# Patient Record
Sex: Male | Born: 1990 | Race: Black or African American | Hispanic: No | Smoking: Never smoker
Health system: Southern US, Community
[De-identification: ages and names within clinical notes are randomized; demographics above are authoritative.]

## PROBLEM LIST (undated history)

## (undated) DIAGNOSIS — F329 Major depressive disorder, single episode, unspecified: Secondary | ICD-10-CM

## (undated) DIAGNOSIS — F32A Depression, unspecified: Secondary | ICD-10-CM

## (undated) DIAGNOSIS — B009 Herpesviral infection, unspecified: Secondary | ICD-10-CM

## (undated) HISTORY — DX: Major depressive disorder, single episode, unspecified: F32.9

## (undated) HISTORY — DX: Depression, unspecified: F32.A

---

## 2014-09-04 ENCOUNTER — Emergency Department: Payer: Self-pay | Admitting: Emergency Medicine

## 2016-01-26 ENCOUNTER — Encounter: Payer: Self-pay | Admitting: Family Medicine

## 2016-01-27 ENCOUNTER — Encounter: Payer: Self-pay | Admitting: Family Medicine

## 2016-01-27 ENCOUNTER — Ambulatory Visit (INDEPENDENT_AMBULATORY_CARE_PROVIDER_SITE_OTHER): Payer: BLUE CROSS/BLUE SHIELD | Admitting: Family Medicine

## 2016-01-27 VITALS — BP 122/80 | HR 83 | Temp 98.3°F | Resp 14 | Ht 66.0 in | Wt 295.5 lb

## 2016-01-27 DIAGNOSIS — Z113 Encounter for screening for infections with a predominantly sexual mode of transmission: Secondary | ICD-10-CM | POA: Diagnosis not present

## 2016-01-27 DIAGNOSIS — Z131 Encounter for screening for diabetes mellitus: Secondary | ICD-10-CM | POA: Diagnosis not present

## 2016-01-27 DIAGNOSIS — Z13220 Encounter for screening for lipoid disorders: Secondary | ICD-10-CM | POA: Diagnosis not present

## 2016-01-27 DIAGNOSIS — B001 Herpesviral vesicular dermatitis: Secondary | ICD-10-CM

## 2016-01-27 DIAGNOSIS — Z0000 Encounter for general adult medical examination without abnormal findings: Secondary | ICD-10-CM | POA: Diagnosis not present

## 2016-01-27 DIAGNOSIS — Z8659 Personal history of other mental and behavioral disorders: Secondary | ICD-10-CM | POA: Insufficient documentation

## 2016-01-27 DIAGNOSIS — Z23 Encounter for immunization: Secondary | ICD-10-CM | POA: Diagnosis not present

## 2016-01-27 DIAGNOSIS — Z114 Encounter for screening for human immunodeficiency virus [HIV]: Secondary | ICD-10-CM

## 2016-01-27 MED ORDER — VALACYCLOVIR HCL 1 G PO TABS: 1000.0000 mg | ORAL_TABLET | Freq: Two times a day (BID) | ORAL | Status: DC

## 2016-01-27 NOTE — Progress Notes (Signed)
Name: Derek Khan   MRN: [id number]    DOB: 1991-04-19   Date:01/27/2016       Progress Note  Subjective  Chief Complaint  Chief Complaint  Patient presents with  . Annual Exam  . Mouth Lesions    patient states they occur out of the blue every month    HPI  Male Exam: he has gained weight but feeling well. Engaged, planning on getting married 07/2017. No urinary symptoms. He snores at times but wakes up feeling rested.   Morbid obesity: he states he has changed his diet over the past 8 of weeks has lost about 20 lbs , by stopping sodas and going to the gym.   Fever Blisters: he states outbreaks have been happening monthly and would like to go back on Valtrex. He states rash is painful and on upper lips, used to be triggered by illness, but now it just happens.   Patient Active Problem List   Diagnosis Date Noted  . History of depression 01/27/2016  . Morbid obesity (HCC) 01/27/2016    History reviewed. No pertinent past surgical history.  Family History  Problem Relation Age of Onset  . Depression Mother   . Obesity Mother   . Obesity Father   . Obesity Sister   . ADD / ADHD Brother   . Obesity Brother     Social History   Social History  . Marital Status: Single    Spouse Name: N/A  . Number of Children: N/A  . Years of Education: N/A   Occupational History  . Not on file.   Social History Main Topics  . Smoking status: Never Smoker   . Smokeless tobacco: Not on file  . Alcohol Use: 0.6 oz/week    1 Standard drinks or equivalent per week     Comment: social drinker  . Drug Use: No  . Sexual Activity:    Partners: Female   Other Topics Concern  . Not on file   Social History Narrative  . No narrative on file    No current outpatient prescriptions on file.  No Known Allergies   ROS  Constitutional: Negative for fever, positive for  weight change.  Respiratory: Negative for cough and shortness of breath.   Cardiovascular: Negative for  chest pain or palpitations.  Gastrointestinal: Negative for abdominal pain, no bowel changes.  Musculoskeletal: Negative for gait problem or joint swelling.  Skin: Negative for rash.  Neurological: Negative for dizziness or headache.  No other specific complaints in a complete review of systems (except as listed in HPI above).  Objective  Filed Vitals:   01/27/16 1105  BP: 122/80  Pulse: 83  Temp: 98.3 F (36.8 C)  TempSrc: Oral  Resp: 14  Height:  (1.676 m)  Weight: 295 lb 8 oz (134.038 kg)  SpO2: 98%    Body mass index is 47.72 kg/(m^2).  Physical Exam  Constitutional: Patient appears well-developed and well-nourished. No distress.  HENT: Head: Normocephalic and atraumatic. Ears: B TMs ok, no erythema or effusion; Nose: Nose normal. Mouth/Throat: Oropharynx is clear and moist. No oropharyngeal exudate.  Eyes: Conjunctivae and EOM are normal. Pupils are equal, round, and reactive to light. No scleral icterus.  Neck: Normal range of motion. Neck supple. No JVD present. No thyromegaly present.  Cardiovascular: Normal rate, regular rhythm and normal heart sounds.  No murmur heard. No BLE edema. Pulmonary/Chest: Effort normal and breath sounds normal. No respiratory distress. Abdominal: Soft. Bowel sounds are normal,  no distension. There is no tenderness. no masses MALE GENITALIA: Normal descended testes bilaterally, no masses palpated, no hernias, no lesions, no discharge RECTAL: not done Musculoskeletal: Normal range of motion, no joint effusions. No gross deformities Neurological: he is alert and oriented to person, place, and time. No cranial nerve deficit. Coordination, balance, strength, speech and gait are normal.  Skin: Skin is warm and dry. No rash noted. No erythema.  Psychiatric: Patient has a normal mood and affect. behavior is normal. Judgment and thought content normal.  PHQ2/9: Depression screen PHQ 2/9 01/27/2016  Decreased Interest 0  Down, Depressed,  Hopeless 0  PHQ - 2 Score 0     Fall Risk: Fall Risk  01/27/2016  Falls in the past year? 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 Encounter for routine history and physical exam for male  Discussed importance of 150 minutes of physical activity weekly, eat two servings of fish weekly, eat one serving of tree nuts ( cashews, pistachios, pecans, almonds.Marland Kitchen.) every other day, eat 6 servings of fruit/vegetables daily and drink plenty of water and avoid sweet beverages.   2. Morbid obesity, unspecified obesity type (HCC)  - TSH Discussed with the patient the risk posed by an increased BMI. Discussed importance of portion control, calorie counting and at least 150 minutes of physical activity weekly. Avoid sweet beverages and drink more water. Eat at least 6 servings of fruit and vegetables daily   3. Lipid screening  - Lipid panel  4. Diabetes mellitus screening  -hgbA1C  5. Encounter for screening for HIV  - HIV antibody  6. Routine screening for STI (sexually transmitted infection)  - Chlamydia/Gonococcus/Trichomonas, NAA  7. Needs flu shot  - Flu Vaccine QUAD 36+ mos IM  8. Need for Tdap vaccination  - Tdap vaccine greater than or equal to 7yo IM

## 2016-01-28 LAB — HEMOGLOBIN A1C
Est. average glucose Bld gHb Est-mCnc: 114 mg/dL
Hgb A1c MFr Bld: 5.6 % (ref 4.8–5.6)

## 2016-01-28 LAB — LIPID PANEL
CHOL/HDL RATIO: 3.8 ratio (ref 0.0–5.0)
Cholesterol, Total: 165 mg/dL (ref 100–199)
HDL: 44 mg/dL (ref >39)
LDL Calculated: 104 mg/dL — ABNORMAL HIGH (ref 0–99)
Triglycerides: 84 mg/dL (ref 0–149)
VLDL CHOLESTEROL CAL: 17 mg/dL (ref 5–40)

## 2016-01-28 LAB — TSH: TSH: 1.43 u[IU]/mL (ref 0.450–4.500)

## 2016-01-28 LAB — HIV ANTIBODY (ROUTINE TESTING W REFLEX): HIV SCREEN 4TH GENERATION: NONREACTIVE

## 2016-01-29 LAB — CHLAMYDIA/GONOCOCCUS/TRICHOMONAS, NAA
CHLAMYDIA BY NAA: NEGATIVE
Gonococcus by NAA: NEGATIVE
Trich vag by NAA: NEGATIVE

## 2016-02-03 NOTE — Addendum Note (Signed)
Addended by: Alba CorySOWLES, Felix Meras F on: 02/03/2016 01:21 PM   Modules accepted: Kipp BroodSmartSet

## 2016-02-06 NOTE — Addendum Note (Signed)
Addended by: Alba CorySOWLES, Edu On F on: 02/06/2016 01:00 PM   Modules accepted: Kipp BroodSmartSet

## 2017-07-19 ENCOUNTER — Ambulatory Visit (INDEPENDENT_AMBULATORY_CARE_PROVIDER_SITE_OTHER): Payer: BLUE CROSS/BLUE SHIELD | Admitting: Family Medicine

## 2017-07-19 ENCOUNTER — Encounter: Payer: Self-pay | Admitting: Family Medicine

## 2017-07-19 DIAGNOSIS — B001 Herpesviral vesicular dermatitis: Secondary | ICD-10-CM | POA: Insufficient documentation

## 2017-07-19 MED ORDER — VALACYCLOVIR HCL 1 G PO TABS
1000.0000 mg | ORAL_TABLET | Freq: Two times a day (BID) | ORAL | 0 refills | Status: DC
Start: 2017-07-19 — End: 2017-12-19

## 2017-07-19 NOTE — Progress Notes (Signed)
Name: Derek Khan   MRN: [id number]    DOB: 06-23-1991   Date:07/19/2017       Progress Note  Subjective  Chief Complaint  Chief Complaint  Patient presents with  . Medication Refill    HPI   Morbid obesity: he states he has changed his diet last year and lost 20 lbs, and states he kept losing until Nov 2017 lowest weight was 260's lbs. He started to not be as compliant with diet plan and has gained some weight back, but is still 12 lbs lighter than last March. He is getting married September 2018 and will resume his diet.   Fever Blisters: he states outbreaks have been happening monthly and would like to go back on Valtrex. 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   Patient Active Problem List   Diagnosis Date Noted  . Fever blister 07/19/2017  . History of depression 01/27/2016  . Morbid obesity (HCC) 01/27/2016    History reviewed. No pertinent surgical history.  Family History  Problem Relation Age of Onset  . Depression Mother   . Obesity Mother   . Obesity Father   . Obesity Sister   . ADD / ADHD Brother   . Obesity Brother     Social History   Social History  . Marital status: Single    Spouse name: N/A  . Number of children: N/A  . Years of education: N/A   Occupational History  . wear house worker  [employer]    physical job   Social History Main Topics  . Smoking status: Never Smoker  . Smokeless tobacco: Never Used  . Alcohol use 0.6 oz/week    1 Standard drinks or equivalent per week     Comment: social drinker  . Drug use: No  . Sexual activity: Yes    Partners: Female   Other Topics Concern  . 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  valACYclovir (VALTREX) 1000 MG tablet, Take 1 tablet (1,000 mg total) by mouth 2 (two) times daily. Prn outbreak, Disp: 30 tablet, Rfl: 0  No Known Allergies   ROS  Ten systems reviewed and is negative except as mentioned in HPI   Objective  Vitals:   07/19/17 1347  BP: 122/64  Pulse: (!) 101  Resp: 18  Temp: 98 F (36.7 C)  SpO2: 98%  Weight: 284 lb 3 oz (128.9 kg)  Height: 5\' 6"  (1.676 m)    Body mass index is 45.87 kg/m.  Physical Exam  Constitutional: Patient appears well-developed and well-nourished. Obese No distress.  HEENT: head atraumatic, normocephalic, pupils equal and reactive to light,  neck supple, throat within normal limits, fever blisters on right upper lip Cardiovascular: Normal rate, regular rhythm and normal heart sounds.  No murmur heard. No BLE edema. Pulmonary/Chest: Effort normal and breath sounds normal. No respiratory distress. Abdominal: Soft.  There is no tenderness. Psychiatric: Patient has a normal mood and affect. behavior is normal. Judgment and thought content normal.  PHQ2/9: Depression screen PHQ 2/9 01/27/2016  Decreased Interest 0  Down, Depressed, Hopeless 0  PHQ - 2 Score 0  Fall Risk: Fall Risk  01/27/2016  Falls in the past year? No    Assessment & Plan  1. Morbid obesity, unspecified obesity type [facility])  Discussed with the patient the risk posed by an increased BMI. Discussed importance of portion control, calorie counting and at least 150 minutes of physical activity weekly. Avoid sweet beverages and drink more water. Eat at least 6 servings of fruit and vegetables daily   2. Fever blister  - valACYclovir (VALTREX) 1000 MG tablet; Take 1 tablet (1,000 mg total) by mouth 2 (two) times daily. Prn outbreak  Dispense: 30 tablet; Refill: 0  He will return for labs and a physical

## 2017-10-28 ENCOUNTER — Ambulatory Visit: Payer: BLUE CROSS/BLUE SHIELD | Admitting: Family Medicine

## 2017-12-19 ENCOUNTER — Ambulatory Visit (INDEPENDENT_AMBULATORY_CARE_PROVIDER_SITE_OTHER): Payer: BLUE CROSS/BLUE SHIELD | Admitting: Family Medicine

## 2017-12-19 ENCOUNTER — Encounter: Payer: Self-pay | Admitting: Family Medicine

## 2017-12-19 VITALS — BP 106/70 | HR 86 | Temp 98.6°F | Resp 14 | Ht 67.0 in | Wt 291.0 lb

## 2017-12-19 DIAGNOSIS — B001 Herpesviral vesicular dermatitis: Secondary | ICD-10-CM

## 2017-12-19 DIAGNOSIS — H9192 Unspecified hearing loss, left ear: Secondary | ICD-10-CM

## 2017-12-19 DIAGNOSIS — R0683 Snoring: Secondary | ICD-10-CM

## 2017-12-19 DIAGNOSIS — Z0000 Encounter for general adult medical examination without abnormal findings: Secondary | ICD-10-CM | POA: Diagnosis not present

## 2017-12-19 DIAGNOSIS — Z13220 Encounter for screening for lipoid disorders: Secondary | ICD-10-CM

## 2017-12-19 DIAGNOSIS — Z131 Encounter for screening for diabetes mellitus: Secondary | ICD-10-CM

## 2017-12-19 MED ORDER — VALACYCLOVIR HCL 1 G PO TABS: 1000.0000 mg | ORAL_TABLET | Freq: Two times a day (BID) | ORAL | 0 refills | Status: DC

## 2017-12-19 NOTE — Progress Notes (Signed)
Name: Derek Khan   MRN: [id number]    DOB: 12-24-1990   Date:12/19/2017       Progress Note  Subjective  Chief Complaint  Chief Complaint  Patient presents with  . Annual Exam    HPI  Patient presents for annual CPE  Wife is concerned about the fact that he snores and has pauses during his sleep, he also has to void during the night, denies headaches in am's but feels sleepy throughout the day  Hearing: he complained of decreased hearing left side, we will check hearing test   USPSTF grade A and B recommendations:  Diet: trying a healthier diet, cooking at home, more protein and veggies avoiding carbohydrate Exercise: active at work, but denies regular exercise routine  Depression:  Dep[facility] screen [facility] 2/9 12/19/2017 01/27/2016  Decreased Interest 0 0  Down, Depressed, Hopeless 0 0  PHQ - 2 Score 0 0    Hypertension:  BP Readings from Last 3 Encounters:  12/19/17 106/70  07/19/17 122/64  01/27/16 122/80    Obesity: Wt Readings from Last 3 Encounters:  12/19/17 291 lb (132 kg)  07/19/17 284 lb 3 oz (128.9 kg)  01/27/16 295 lb 8 oz (134 kg)   BMI Readings from Last 3 Encounters:  12/19/17 45.58 kg/m  07/19/17 45.87 kg/m  01/27/16 47.69 kg/m     Lipids:  Lab Results  Component Value Date   CHOL 165 01/27/2016   Lab Results  Component Value Date   HDL 44 01/27/2016   Lab Results  Component Value Date   LDLCALC 104 (H) 01/27/2016   Lab Results  Component Value Date   TRIG 84 01/27/2016   Lab Results  Component Value Date   CHOLHDL 3.8 01/27/2016    Married STD testing and prevention (chl/gon/syphilis): not interested HIV, hep C: not interested   IPSS Questionnaire (AUA-7): Over the past month.   1)  How often have you had a sensation of not emptying your bladder completely after you finish urinating?  0 - Not at all  2)  How often have you had to urinate again less than two hours after you finished urinating? 0 - Not at all  3)   How often have you found you stopped and started again several times when you urinated?  0 - Not at all  4) How difficult have you found it to postpone urination?  1 - Lees than 1 in time 5  5) How often have you had a weak urinary stream?  1 - Less than 1 time in 5  6) How often have you had to push or strain to begin urination?  0 - Not at all  7) How many times did you most typically get up to urinate from the time you went to bed until the time you got up in the morning?  1 - 1 time  Total score:  0-7 mildly symptomatic   8-19 moderately symptomatic   20-35 severely symptomatic    Advanced Care Planning: A voluntary discussion about advance care planning including the explanation and discussion of advance directives.  Discussed health care proxy and Living will, and the patient was able to identify a health care proxy as wife .  Patient does not have a living will at present time. If patient does have living will, I have requested they bring this to the clinic to be scanned in to their chart.  Patient Active Problem List   Diagnosis Date Noted  . Fever  blister 07/19/2017  . History of depression 01/27/2016  . Morbid obesity (HCC) 01/27/2016    History reviewed. No pertinent surgical history.  Family History  Problem Relation Age of Onset  . Depression Mother   . Obesity Mother   . Obesity Father   . Obesity Sister   . ADD / ADHD Brother   . Obesity Brother     Social History   Socioeconomic History  . Marital status: Married    Spouse name: Lesly Rubenstein  . Number of children: 0  . Years of education: Not on file  . Highest education level: Some college, no degree  Social Needs  . Financial resource strain: Not very hard  . Food insecurity - worry: Never true  . Food insecurity - inability: Never true  . Transportation needs - medical: No  . Transportation needs - non-medical: No  Occupational History  . Occupation: Database administrator     Employer: [id number]    Comment:  physical job  Tobacco Use  . Smoking status: Never Smoker  . Smokeless tobacco: Never Used  Substance and Sexual Activity  . Alcohol use: Yes    Alcohol/week: 0.6 oz    Types: 1 Standard drinks or equivalent per week    Comment: social drinker  . Drug use: No  . Sexual activity: Yes    Partners: Female    Birth control/protection: Other-see comments    Comment: wife takes opc  Other Topics Concern  . 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  valACYclovir (VALTREX) 1000 MG tablet, Take 1 tablet (1,000 mg total) by mouth 2 (two) times daily. Prn outbreak, Disp: 30 tablet, Rfl: 0  No Known Allergies   ROS   Constitutional: Negative for fever, positive for  weight change.  Respiratory: Negative for cough and shortness of breath.   Cardiovascular: Negative for chest pain or palpitations.  Gastrointestinal: Negative for abdominal pain, no bowel changes.  Musculoskeletal: Negative for gait problem or joint swelling.  Skin: Negative for rash.  Neurological: Negative for dizziness or headache.  No other specific complaints in a complete review of systems (except as listed in HPI above).   Objective  Vitals:   12/19/17 1019  BP: 106/70  Pulse: 86  Resp: 14  Temp: 98.6 F (37 C)  TempSrc: Oral  SpO2: 98%  Weight: 291 lb (132 kg)  Height: 5\' 7"  (1.702 m)    Body mass index is 45.58 kg/m.  Physical Exam  Constitutional: Patient appears well-developed and obese  No distress.  HENT: Head: Normocephalic and atraumatic. Ears: B TMs ok, no erythema or effusion; Nose: Nose normal. Mouth/Throat: Oropharynx is clear and moist. No oropharyngeal exudate.  Eyes: Conjunctivae and EOM are normal. Pupils are equal, round, and reactive to light. No scleral icterus.  Neck: Normal range of motion. Neck supple. No JVD present. No thyromegaly present.   Cardiovascular: Normal rate, regular rhythm and normal heart sounds.  No murmur heard. No BLE edema. Pulmonary/Chest: Effort normal and breath sounds normal. No respiratory distress. Abdominal: Soft. Bowel sounds are normal, no distension. There is no tenderness. no masses MALE GENITALIA: Normal descended testes bilaterally, no masses palpated, no hernias, no lesions, no discharge RECTAL:not done Musculoskeletal: Normal range of motion, no joint effusions. No gross deformities Neurological: he is alert and oriented to person, place, and time. No cranial nerve  deficit. Coordination, balance, strength, speech and gait are normal.  Skin: Skin is warm and dry. No rash noted. No erythema.  Psychiatric: Patient has a normal mood and affect. behavior is normal. Judgment and thought content normal.   PHQ2/9: Depression screen [facility] 2/9 12/19/2017 01/27/2016  Decreased Interest 0 0  Down, Depressed, Hopeless 0 0  PHQ - 2 Score 0 0     Fall Risk: Fall Risk  12/19/2017 01/27/2016  Falls in the past year? No No     Functional Status Survey: Is the patient deaf or have difficulty hearing?: No Does the patient have difficulty seeing, even when wearing glasses/contacts?: No Does the patient have difficulty concentrating, remembering, or making decisions?: No Does the patient have difficulty walking or climbing stairs?: No Does the patient have difficulty dressing or bathing?: No Does the patient have difficulty doing errands alone such as visiting a doctor's office or shopping?: No    Hearing Screening   125Hz  250Hz  500Hz  1000Hz  2000Hz  3000Hz  4000Hz  6000Hz  8000Hz   Right ear:   Fail Fail Pass  Pass    Left ear:   Pass Pass Pass  Pass      Assessment & Plan  1. Encounter for routine history and physical exam for male  Discussed importance of 150 minutes of physical activity weekly, eat two servings of fish weekly, eat one serving of tree nuts ( cashews, pistachios, pecans, almonds.Marland Kitchen) every other  day, eat 6 servings of fruit/vegetables daily and drink plenty of water and avoid sweet beverages.  - COMPLETE METABOLIC PANEL WITH GFR - CBC with Differential/Platelet - Hemoglobin A1c - Lipid panel  2. Lipid screening  - Lipid panel  3. Diabetes mellitus screening  - Hemoglobin A1c  4. Morbid obesity, unspecified obesity type [facility])  Discussed importance of 150 minutes of physical activity weekly, eat two servings of fish weekly, eat one serving of tree nuts ( cashews, pistachios, pecans, almonds.Marland Kitchen) every other day, eat 6 servings of fruit/vegetables daily and drink plenty of water and avoid sweet beverages.   5. Snoring  - Ambulatory referral to Sleep Studies  6. Decreased hearing of left ear  - Ambulatory referral to ENT

## 2017-12-19 NOTE — Patient Instructions (Signed)
Preventive Care 18-39 Years, Male Preventive care refers to lifestyle choices and visits with your health care provider that can promote health and wellness. What does preventive care include?  A yearly physical exam. This is also called an annual well check.  Dental exams once or twice a year.  Routine eye exams. Ask your health care provider how often you should have your eyes checked.  Personal lifestyle choices, including: ? Daily care of your teeth and gums. ? Regular physical activity. ? Eating a healthy diet. ? Avoiding tobacco and drug use. ? Limiting alcohol use. ? Practicing safe sex. What happens during an annual well check? The services and screenings done by your health care provider during your annual well check will depend on your age, overall health, lifestyle risk factors, and family history of disease. Counseling Your health care provider may ask you questions about your:  Alcohol use.  Tobacco use.  Drug use.  Emotional well-being.  Home and relationship well-being.  Sexual activity.  Eating habits.  Work and work Statistician.  Screening You may have the following tests or measurements:  Height, weight, and BMI.  Blood pressure.  Lipid and cholesterol levels. These may be checked every 5 years starting at age 34.  Diabetes screening. This is done by checking your blood sugar (glucose) after you have not eaten for a while (fasting).  Skin check.  Hepatitis C blood test.  Hepatitis B blood test.  Sexually transmitted disease (STD) testing.  Discuss your test results, treatment options, and if necessary, the need for more tests with your health care provider. Vaccines Your health care provider may recommend certain vaccines, such as:  Influenza vaccine. This is recommended every year.  Tetanus, diphtheria, and acellular pertussis (Tdap, Td) vaccine. You may need a Td booster every 10 years.  Varicella vaccine. You may need this if you  have not been vaccinated.  HPV vaccine. If you are 23 or younger, you may need three doses over 6 months.  Measles, mumps, and rubella (MMR) vaccine. You may need at least one dose of MMR.You may also need a second dose.  Pneumococcal 13-valent conjugate (PCV13) vaccine. You may need this if you have certain conditions and have not been vaccinated.  Pneumococcal polysaccharide (PPSV23) vaccine. You may need one or two doses if you smoke cigarettes or if you have certain conditions.  Meningococcal vaccine. One dose is recommended if you are age 65-21 years and a first-year college student living in a residence hall, or if you have one of several medical conditions. You may also need additional booster doses.  Hepatitis A vaccine. You may need this if you have certain conditions or if you travel or work in places where you may be exposed to hepatitis A.  Hepatitis B vaccine. You may need this if you have certain conditions or if you travel or work in places where you may be exposed to hepatitis B.  Haemophilus influenzae type b (Hib) vaccine. You may need this if you have certain risk factors.  Talk to your health care provider about which screenings and vaccines you need and how often you need them. This information is not intended to replace advice given to you by your health care provider. Make sure you discuss any questions you have with your health care provider. Document Released: 01/04/2002 Document Revised: 07/28/2016 Document Reviewed: 09/09/2015 Elsevier Interactive Patient Education  [employer].

## 2017-12-20 LAB — COMPLETE METABOLIC PANEL WITH GFR
AG Ratio: 1.4 (calc) (ref 1.0–2.5)
ALBUMIN MSPROF: 4.7 g/dL (ref 3.6–5.1)
ALKALINE PHOSPHATASE (APISO): 80 U/L (ref 40–115)
ALT: 22 U/L (ref 9–46)
AST: 17 U/L (ref 10–40)
BILIRUBIN TOTAL: 0.3 mg/dL (ref 0.2–1.2)
BUN: 17 mg/dL (ref 7–25)
CHLORIDE: 103 mmol/L (ref 98–110)
CO2: 26 mmol/L (ref 20–32)
CREATININE: 0.83 mg/dL (ref 0.60–1.35)
Calcium: 10.1 mg/dL (ref 8.6–10.3)
GFR, Est African American: 142 mL/min/{1.73_m2} (ref >=60)
GFR, Est Non African American: 122 mL/min/{1.73_m2} (ref >=60)
GLUCOSE: 82 mg/dL (ref 65–99)
Globulin: 3.4 g/dL (calc) (ref 1.9–3.7)
Potassium: 4.1 mmol/L (ref 3.5–5.3)
Sodium: 138 mmol/L (ref 135–146)
Total Protein: 8.1 g/dL (ref 6.1–8.1)

## 2017-12-20 LAB — CBC WITH DIFFERENTIAL/PLATELET
Basophils Absolute: 46 cells/uL (ref 0–200)
Basophils Relative: 0.5 %
EOS ABS: 120 {cells}/uL (ref 15–500)
Eosinophils Relative: 1.3 %
HEMATOCRIT: 45 % (ref 38.5–50.0)
Hemoglobin: 15.2 g/dL (ref 13.2–17.1)
LYMPHS ABS: 3156 {cells}/uL (ref 850–3900)
MCH: 26.4 pg — ABNORMAL LOW (ref 27.0–33.0)
MCHC: 33.8 g/dL (ref 32.0–36.0)
MCV: 78.1 fL — AB (ref 80.0–100.0)
MPV: 11.4 fL (ref 7.5–12.5)
Monocytes Relative: 6.8 %
Neutro Abs: 5253 cells/uL (ref 1500–7800)
Neutrophils Relative %: 57.1 %
Platelets: 208 10*3/uL (ref 140–400)
RBC: 5.76 10*6/uL (ref 4.20–5.80)
RDW: 13.7 % (ref 11.0–15.0)
Total Lymphocyte: 34.3 %
WBC: 9.2 10*3/uL (ref 3.8–10.8)
WBCMIX: 626 {cells}/uL (ref 200–950)

## 2017-12-20 LAB — HEMOGLOBIN A1C
HEMOGLOBIN A1C: 5.3 %{Hb} (ref <5.7)
MEAN PLASMA GLUCOSE: 105 (calc)
eAG (mmol/L): 5.8 (calc)

## 2017-12-20 LAB — LIPID PANEL
Cholesterol: 167 mg/dL (ref <200)
HDL: 45 mg/dL (ref >40)
LDL Cholesterol (Calc): 104 mg/dL (calc) — ABNORMAL HIGH
NON-HDL CHOLESTEROL (CALC): 122 mg/dL (ref <130)
Total CHOL/HDL Ratio: 3.7 (calc) (ref <5.0)
Triglycerides: 89 mg/dL (ref <150)

## 2018-05-08 ENCOUNTER — Encounter: Payer: Self-pay | Admitting: Nurse Practitioner

## 2018-05-08 ENCOUNTER — Ambulatory Visit (INDEPENDENT_AMBULATORY_CARE_PROVIDER_SITE_OTHER): Payer: BLUE CROSS/BLUE SHIELD | Admitting: Nurse Practitioner

## 2018-05-08 VITALS — BP 124/68 | HR 93 | Temp 98.5°F | Resp 18 | Ht 67.0 in | Wt 302.3 lb

## 2018-05-08 DIAGNOSIS — Z6841 Body Mass Index (BMI) 40.0 and over, adult: Secondary | ICD-10-CM

## 2018-05-08 DIAGNOSIS — M5432 Sciatica, left side: Secondary | ICD-10-CM

## 2018-05-08 MED ORDER — NAPROXEN 250 MG PO TABS: 250.0000 mg | ORAL_TABLET | Freq: Two times a day (BID) | ORAL | 0 refills | Status: DC

## 2018-05-08 MED ORDER — TIZANIDINE HCL 2 MG PO CAPS: 2.0000 mg | ORAL_CAPSULE | Freq: Three times a day (TID) | ORAL | 0 refills | Status: DC

## 2018-05-08 NOTE — Progress Notes (Addendum)
Name: Derek Khan   MRN: [id number]    DOB: [birth year]   Date:05/08/2018       Progress Note  Subjective  Chief Complaint  Chief Complaint  Patient presents with  . Leg Pain    left side on and off for 2 months  . Hip Pain    HPI [place] pain, denies swelling.  Patient endorses left leg pain ongoing for 2 months. Pain is intermittent. Sitting exacerbates pain and is resolves with walking. Patient has been taking tylenol and ibuprofen- sts ibuprofen works a little bit. Denies any injuries, shob, chest pain, does not smoke, no clotting disorders. Pain startes in thigh, if seated for a long time goes to knee and then when he stands up pain radiates up from hip/buttocks down left thight. States does stretching is hard due to pain. Sts pain has progressively worsened since it started but has been relatively the same in the last few weeks.   Patient Active Problem List   Diagnosis Date Noted  . Fever blister 07/19/2017  . History of depression 01/27/2016  . Morbid obesity (HCC) 01/27/2016    Past Medical History:  Diagnosis Date  . Depression     No past surgical history on file.  Social History   Tobacco Use  . Smoking status: Never Smoker  . Smokeless tobacco: Never Used  Substance Use Topics  . Alcohol use: Yes    Alcohol/week: 0.6 oz    Types: 1 Standard drinks or equivalent per week    Comment: social drinker     Current Outpatient Medications:  .  valACYclovir (VALTREX) 1000 MG tablet, Take 1 tablet (1,000 mg total) by mouth 2 (two) times daily. Prn outbreak, Disp: 30 tablet, Rfl: 0  No Known Allergies  ROS  Constitutional: Negative for fever or weight change.  Respiratory: Negative for cough and shortness of breath.   Cardiovascular: Negative for chest pain or palpitations.  Gastrointestinal: Negative for abdominal pain, no bowel changes.  Musculoskeletal: Negative for gait problem or joint  swelling.  Skin: Negative for rash.  Neurological: Negative for dizziness or headache.  No other specific complaints in a complete review of systems (except as listed in HPI above).  Objective  Vitals:   05/08/18 0927  BP: 124/68  Pulse: 93  Resp: 18  Temp: 98.5 F (36.9 C)  TempSrc: Oral  SpO2: 96%  Weight: (!) 302 lb 4.8 oz (137.1 kg)  Height: 5\' 7"  (1.702 m)    Body mass index is 47.35 kg/m.  Nursing Note and Vital Signs reviewed.  Physical Exam   Constitutional: Patient appears well-developed and well-nourished. Obese  No distress.  Cardiovascular: Normal rate, regular rhythm, S1/S2 present.  No murmur or rub heard.  Pulmonary/Chest: Effort normal and breath sounds clear. No respiratory distress or retractions. MSK: positive straight leg test causes more pain in left leg, patient has gluteal tenderness to palpation on left side. Right leg negative. No obvious deformities or swelling bilaterally, no heat noted.  Psychiatric: Patient has a normal mood and affect. behavior is normal. Judgment and thought content normal.  No results found for this or any previous visit (from the past 72 hour(s)).  Assessment & Plan  1. Sciatica of left side -low suspicion of blood clot due to phyiscal exam findings, however discussed risks and benefits of imaging, and ER precautions - Discussed weight loss  - naproxen (NAPROSYN) 250 MG tablet; Take 1 tablet (250 mg  total) by mouth 2 (two) times daily with a meal.  Dispense: 20 tablet; Refill: 0 - tizanidine (ZANAFLEX) 2 MG capsule; Take 1 capsule (2 mg total) by mouth 3 (three) times daily.  Dispense: 30 capsule; Refill: 0   2. Morbid obesity (HCC) Lost significant weight last year when he was regularly going to the gym and eating healthy but got different job and is less active. Plans to go back to eating healthy and working out.   3. BMI 45.0-49.9, adult (HCC)    -Red flags and when to present for emergency care or RTC including  fever >101.66F, chest pain, shortness of breath, new/worsening/un-resolving symptoms, leg swelling, bowel bladder incontinence reviewed with patient at time of visit. Follow up and care instructions discussed and provided in AVS.  ---------------------------------------- I have reviewed this encounter including the documentation in this note and/or discussed this patient with the provider, Sharyon Cable DNP AGNP-C. I am certifying that I agree with the content of this note as supervising physician. Baruch Gouty, MD [facility] 05/09/2018, 5:26 PM

## 2018-05-08 NOTE — Patient Instructions (Addendum)
- Use ice/heat on area for 20 minutes 4-5 times a day - Take naproxen twice day with meals ( do not take ibuprofen or aspirin with this medicine) If you have additional pain you can take tylenol - Take muscle relaxer as needed - About an hour after taking pain meds/muscle relaxer try to do light stretching - seek immediate medical attention if you are having chest pain, shortness of breath, bowel or bladder incontinence.   Back Exercises If you have pain in your back, do these exercises 2-3 times each day or as told by your doctor. When the pain goes away, do the exercises once each day, but repeat the steps more times for each exercise (do more repetitions). If you do not have pain in your back, do these exercises once each day or as told by your doctor. Exercises Single Knee to Chest  Do these steps 3-5 times in a row for each leg: 1. Lie on your back on a firm bed or the floor with your legs stretched out. 2. Bring one knee to your chest. 3. Hold your knee to your chest by grabbing your knee or thigh. 4. Pull on your knee until you feel a gentle stretch in your lower back. 5. Keep doing the stretch for 10-30 seconds. 6. Slowly let go of your leg and straighten it.  Pelvic Tilt  Do these steps 5-10 times in a row: 1. Lie on your back on a firm bed or the floor with your legs stretched out. 2. Bend your knees so they point up to the ceiling. Your feet should be flat on the floor. 3. Tighten your lower belly (abdomen) muscles to press your lower back against the floor. This will make your tailbone point up to the ceiling instead of pointing down to your feet or the floor. 4. Stay in this position for 5-10 seconds while you gently tighten your muscles and breathe evenly.  Cat-Cow  Do these steps until your lower back bends more easily: 1. Get on your hands and knees on a firm surface. Keep your hands under your shoulders, and keep your knees under your hips. You may put padding under  your knees. 2. Let your head hang down, and make your tailbone point down to the floor so your lower back is round like the back of a cat. 3. Stay in this position for 5 seconds. 4. Slowly lift your head and make your tailbone point up to the ceiling so your back hangs low (sags) like the back of a cow. 5. Stay in this position for 5 seconds.  Press-Ups  Do these steps 5-10 times in a row: 1. Lie on your belly (face-down) on the floor. 2. Place your hands near your head, about shoulder-width apart. 3. While you keep your back relaxed and keep your hips on the floor, slowly straighten your arms to raise the top half of your body and lift your shoulders. Do not use your back muscles. To make yourself more comfortable, you may change where you place your hands. 4. Stay in this position for 5 seconds. 5. Slowly return to lying flat on the floor.  Bridges  Do these steps 10 times in a row: 1. Lie on your back on a firm surface. 2. Bend your knees so they point up to the ceiling. Your feet should be flat on the floor. 3. Tighten your butt muscles and lift your butt off of the floor until your waist is almost as high  as your knees. If you do not feel the muscles working in your butt and the back of your thighs, slide your feet 1-2 inches farther away from your butt. 4. Stay in this position for 3-5 seconds. 5. Slowly lower your butt to the floor, and let your butt muscles relax.  If this exercise is too easy, try doing it with your arms crossed over your chest. Belly Crunches  Do these steps 5-10 times in a row: 1. Lie on your back on a firm bed or the floor with your legs stretched out. 2. Bend your knees so they point up to the ceiling. Your feet should be flat on the floor. 3. Cross your arms over your chest. 4. Tip your chin a little bit toward your chest but do not bend your neck. 5. Tighten your belly muscles and slowly raise your chest just enough to lift your shoulder blades a tiny  bit off of the floor. 6. Slowly lower your chest and your head to the floor.  Back Lifts Do these steps 5-10 times in a row: 1. Lie on your belly (face-down) with your arms at your sides, and rest your forehead on the floor. 2. Tighten the muscles in your legs and your butt. 3. Slowly lift your chest off of the floor while you keep your hips on the floor. Keep the back of your head in line with the curve in your back. Look at the floor while you do this. 4. Stay in this position for 3-5 seconds. 5. Slowly lower your chest and your face to the floor.  Contact a doctor if:  Your back pain gets a lot worse when you do an exercise.  Your back pain does not lessen 2 hours after you exercise. If you have any of these problems, stop doing the exercises. Do not do them again unless your doctor says it is okay. Get help right away if:  You have sudden, very bad back pain. If this happens, stop doing the exercises. Do not do them again unless your doctor says it is okay. This information is not intended to replace advice given to you by your health care provider. Make sure you discuss any questions you have with your health care provider. Document Released: 12/11/2010 Document Revised: 04/15/2016 Document Reviewed: 01/02/2015 Elsevier Interactive Patient Education  2018 [employer]   Healthy Living for prevention of Diabetes What nutrition changes can be made?  Eat healthy meals and snacks regularly. Keep a healthy snack with you for when you get hungry between meals, such as fruit or a handful of nuts.  Eat lean meats and proteins that are low in saturated fats, such as chicken, fish, egg whites, and beans. Avoid processed meats.  Eat plenty of fruits and vegetables and plenty of grains that have not been processed (whole grains). It is recommended that you eat: ? 1?2 cups of fruit every day. ? 2?3 cups of vegetables every day. ? 6?8 oz of whole grains every day, such as oats, whole  wheat, bulgur, brown rice, quinoa, and millet.  Eat low-fat dairy products, such as milk, yogurt, and cheese.  Eat foods that contain healthy fats, such as nuts, avocado, olive oil, and canola oil.  Drink water throughout the day. Avoid drinks that contain added sugar, such as soda or sweet tea.  Follow instructions from your health care provider about specific eating or drinking restrictions.  Control how much food you eat at a time (portion size). ? Check  food labels to find out the serving sizes of foods. ? Use a kitchen scale to weigh amounts of foods.  Saute or steam food instead of frying it. Cook with water or broth instead of oils or butter.  Limit your intake of: ? Salt (sodium). Have no more than 1 tsp (2,400 mg) of sodium a day. If you have heart disease or high blood pressure, have less than ? tsp (1,500 mg) of sodium a day. ? Saturated fat. This is fat that is solid at room temperature, such as butter or fat on meat. What lifestyle changes can be made?  Activity  Do moderate-intensity physical activity for at least 30 minutes on at least 5 days of the week, or as much as told by your health care provider.  Ask your health care provider what activities are safe for you. A mix of physical activities may be best, such as walking, swimming, cycling, and strength training.  Try to add physical activity into your day. For example: ? Park in spots that are farther away than usual, so that you walk more. For example, park in a far corner of the parking lot when you go to the office or the grocery store. ? Take a walk during your lunch break. ? Use stairs instead of elevators or escalators. Weight Loss  Lose weight as directed. Your health care provider can determine how much weight loss is best for you and can help you lose weight safely.  If you are overweight or obese, you may be instructed to lose at least 5?7 % of your body weight. Alcohol and Tobacco   Limit alcohol  intake to no more than 1 drink a day for nonpregnant women and 2 drinks a day for men. One drink equals 12 oz of beer, 5 oz of wine, or 1 oz of hard liquor.  Do not use any tobacco products, such as cigarettes, chewing tobacco, and e-cigarettes. If you need help quitting, ask your health care provider. Work With Your Health Care Provider  Have your blood glucose tested regularly, as told by your health care provider.  Discuss your risk factors and how you can reduce your risk for diabetes.  Get screening tests as told by your health care provider. You may have screening tests regularly, especially if you have certain risk factors for type 2 diabetes.  Make an appointment with a diet and nutrition specialist (registered dietitian). A registered dietitian can help you make a healthy eating plan and can help you understand portion sizes and food labels. Why are these changes important?  It is possible to prevent or delay type 2 diabetes and related health problems by making lifestyle and nutrition changes.  It can be difficult to recognize signs of type 2 diabetes. The best way to avoid possible damage to your body is to take actions to prevent the disease before you develop symptoms. What can happen if changes are not made?  Your blood glucose levels may keep increasing. Having high blood glucose for a long time is dangerous. Too much glucose in your blood can damage your blood vessels, heart, kidneys, nerves, and eyes.  You may develop prediabetes or type 2 diabetes. Type 2 diabetes can lead to many chronic health problems and complications, such as: ? Heart disease. ? Stroke. ? Blindness. ? Kidney disease. ? Depression. ? Poor circulation in the feet and legs, which could lead to surgical removal (amputation) in severe cases. Where to find support:  Ask your health  care provider to recommend a registered dietitian, diabetes educator, or weight loss program.  Look for local or online  weight loss groups.  Join a gym, fitness club, or outdoor activity group, such as a walking club. Where to find more information: To learn more about diabetes and diabetes prevention, visit:  American Diabetes Association (ADA): www.diabetes.[employer] of Diabetes and Digestive and Kidney Diseases: ToyArticles.ca  To learn more about healthy eating, visit:  The U.[place]. Department of Agriculture Architect), Choose My Plate: http://yates.biz/  Office of Disease Prevention and Health Promotion (ODPHP), Dietary Guidelines: ListingMagazine.si  Summary  You can reduce your risk for type 2 diabetes by increasing your physical activity, eating healthy foods, and losing weight as directed.  Talk with your health care provider about your risk for type 2 diabetes. Ask about any blood tests or screening tests that you need to have. This information is not intended to replace advice given to you by your health care provider. Make sure you discuss any questions you have with your health care provider. Document Released: 03/01/2016 Document Revised: 04/15/2016 Document Reviewed: 12/30/2015 Elsevier Interactive Patient Education  [employer].

## 2018-05-22 ENCOUNTER — Encounter: Payer: Self-pay | Admitting: Nurse Practitioner

## 2018-05-22 ENCOUNTER — Ambulatory Visit (INDEPENDENT_AMBULATORY_CARE_PROVIDER_SITE_OTHER): Payer: BLUE CROSS/BLUE SHIELD | Admitting: Nurse Practitioner

## 2018-05-22 VITALS — BP 101/62 | HR 94 | Temp 98.2°F | Resp 16 | Ht 67.0 in | Wt 300.6 lb

## 2018-05-22 DIAGNOSIS — M5432 Sciatica, left side: Secondary | ICD-10-CM

## 2018-05-22 NOTE — Patient Instructions (Signed)
Back Injury Prevention °Back injuries can be very painful. They can also be difficult to heal. After having one back injury, you are more likely to injure your back again. It is important to learn how to avoid injuring or re-injuring your back. The following tips can help you to prevent a back injury. °What should I know about physical fitness? °· Exercise for 30 minutes per day on most days of the week or as told by your doctor. Make sure to: °? Do aerobic exercises, such as walking, jogging, biking, or swimming. °? Do exercises that increase balance and strength, such as tai chi and yoga. °? Do stretching exercises. This helps with flexibility. °? Try to develop strong belly (abdominal) muscles. Your belly muscles help to support your back. °· Stay at a healthy weight. This helps to decrease your risk of a back injury. °What should I know about my diet? °· Talk with your doctor about your overall diet. Take supplements and vitamins only as told by your doctor. °· Talk with your doctor about how much calcium and vitamin D you need each day. These nutrients help to prevent weakening of the bones (osteoporosis). °· Include good sources of calcium in your diet, such as: °? Dairy products. °? Green leafy vegetables. °? Products that have had calcium added to them (fortified). °· Include good sources of vitamin D in your diet, such as: °? Milk. °? Foods that have had vitamin D added to them. °What should I know about my posture? °· Sit up straight and stand up straight. Avoid leaning forward when you sit or hunching over when you stand. °· Choose chairs that have good low-back (lumbar) support. °· If you work at a desk, sit close to it so you do not need to lean over. Keep your chin tucked in. Keep your neck drawn back. Keep your elbows bent so your arms look like the letter "L" (right angle). °· Sit high and close to the steering wheel when you drive. Add a low-back support to your car seat, if needed. °· Avoid sitting  or standing in one position for very long. Take breaks to get up, stretch, and walk around at least one time every hour. Take breaks every hour if you are driving for long periods of time. °· Sleep on your side with your knees slightly bent, or sleep on your back with a pillow under your knees. Do not lie on the front of your body to sleep. °What should I know about lifting, twisting, and reaching? °Lifting and Heavy Lifting ° °· Avoid heavy lifting, especially lifting over and over again. If you must do heavy lifting: °? Stretch before lifting. °? Work slowly. °? Rest between lifts. °? Use a tool such as a cart or a dolly to move objects if one is available. °? Make several small trips instead of carrying one heavy load. °? Ask for help when you need it, especially when moving big objects. °· Follow these steps when lifting: °? Stand with your feet shoulder-width apart. °? Get as close to the object as you can. Do not pick up a heavy object that is far from your body. °? Use handles or lifting straps if they are available. °? Bend at your knees. Squat down, but keep your heels off the floor. °? Keep your shoulders back. Keep your chin tucked in. Keep your back straight. °? Lift the object slowly while you tighten the muscles in your legs, belly, and butt. Keep the   object as close to the center of your body as possible. °· Follow these steps when putting down a heavy load: °? Stand with your feet shoulder-width apart. °? Lower the object slowly while you tighten the muscles in your legs, belly, and butt. Keep the object as close to the center of your body as possible. °? Keep your shoulders back. Keep your chin tucked in. Keep your back straight. °? Bend at your knees. Squat down, but keep your heels off the floor. °? Use handles or lifting straps if they are available. °Twisting and Reaching °· Avoid lifting heavy objects above your waist. °· Do not twist at your waist while you are lifting or carrying a load. If  you need to turn, move your feet. °· Do not bend over without bending at your knees. °· Avoid reaching over your head, across a table, or for an object on a high surface. °What are some other tips? °· Avoid wet floors and icy ground. Keep sidewalks clear of ice to prevent falls. °· Do not sleep on a mattress that is too soft or too hard. °· Keep items that you use often within easy reach. °· Put heavier objects on shelves at waist level, and put lighter objects on lower or higher shelves. °· Find ways to lower your stress, such as: °? Exercise. °? Massage. °? Relaxation techniques. °· Talk with your doctor if you feel anxious or depressed. These conditions can make back pain worse. °· Wear flat heel shoes with cushioned soles. °· Avoid making quick (sudden) movements. °· Use both shoulder straps when carrying a backpack. °· Do not use any tobacco products, including cigarettes, chewing tobacco, or electronic cigarettes. If you need help quitting, ask your doctor. °This information is not intended to replace advice given to you by your health care provider. Make sure you discuss any questions you have with your health care provider. °Document Released: 04/26/2008 Document Revised: 04/15/2016 Document Reviewed: 11/12/2014 °Elsevier Interactive Patient Education © 2018 Elsevier Inc. ° °

## 2018-05-22 NOTE — Progress Notes (Addendum)
Name: Derek Khan   MRN: [id number]    DOB: 1991/06/06   Date:05/22/2018       Progress Note  Subjective  Chief Complaint  Chief Complaint  Patient presents with  . Sciatica    on left side    HPI Back pain Pt was seen 2 weeks ago for left buttocks and leg pain- recommended ice/heat, naproxen, tizanidine and stretching. Has been taking naproxen and occasionally doing stretches with moderate relief. States he hasn't taken muscle relaxer or used ice/heat yet.   Wt Readings from Last 3 Encounters:  05/22/18 (!) 300 lb 9.6 oz (136.4 kg)  05/08/18 (!) 302 lb 4.8 oz (137.1 kg)  12/19/17 291 lb (132 kg)   Obesity  Drinks a lot of water, packs lunch; states getting ready to go to vacation in [place] but plans to eat well when returning.    Patient Active Problem List   Diagnosis Date Noted  . Fever blister 07/19/2017  . History of depression 01/27/2016  . Morbid obesity (HCC) 01/27/2016    Past Medical History:  Diagnosis Date  . Depression     History reviewed. No pertinent surgical history.  Social History   Tobacco Use  . Smoking status: Never Smoker  . Smokeless tobacco: Never Used  Substance Use Topics  . Alcohol use: Yes    Alcohol/week: 0.6 oz    Types: 1 Standard drinks or equivalent per week    Comment: social drinker     Current Outpatient Medications:  .  naproxen (NAPROSYN) 250 MG tablet, Take 1 tablet (250 mg total) by mouth 2 (two) times daily with a meal., Disp: 20 tablet, Rfl: 0 .  tizanidine (ZANAFLEX) 2 MG capsule, Take 1 capsule (2 mg total) by mouth 3 (three) times daily., Disp: 30 capsule, Rfl: 0 .  valACYclovir (VALTREX) 1000 MG tablet, Take 1 tablet (1,000 mg total) by mouth 2 (two) times daily. Prn outbreak, Disp: 30 tablet, Rfl: 0  No Known Allergies  ROS  Constitutional: Negative for fever or weight change.  Respiratory: Negative for cough and shortness of breath.   Cardiovascular: Negative for chest pain or palpitations.   Gastrointestinal: Negative for abdominal pain, no bowel changes.  Musculoskeletal: Negative for gait problem or joint swelling.  Skin: Negative for rash.  Neurological: Negative for dizziness or headache.  No other specific complaints in a complete review of systems (except as listed in HPI above).  Objective  Vitals:   05/22/18 1314  BP: 101/62  Pulse: 94  Resp: 16  Temp: 98.2 F (36.8 C)  TempSrc: Oral  SpO2: 96%  Weight: (!) 300 lb 9.6 oz (136.4 kg)  Height: 5\' 7"  (1.702 m)     Body mass index is 47.08 kg/m.  Nursing Note and Vital Signs reviewed.  Physical Exam   Constitutional: Patient appears well-developed and well-nourished. Obese  No distress.  Cardiovascular: Normal rate, regular rhythm, S1/S2 present.  No murmur or rub heard.  Pulmonary/Chest: Effort normal and breath sounds clear. No respiratory distress or retractions. MSK: steady gait, full ROM Psychiatric: Patient has a normal mood and affect. behavior is normal. Judgment and thought content normal.  No results found for this or any previous visit (from the past 72 hour(s)).  Assessment & Plan  1. Sciatica of left side - discussed naproxen was not long-term solution; encouraged non-pharm management and proper body mechanics at work.   2. Morbid obesity (HCC) -education    ------------------------------------------------------ I have reviewed this encounter including the documentation  in this note and/or discussed this patient with the provider, Sharyon CableElizabeth Miel Wisener DNP AGNP-C. I am certifying that I agree with the content of this note as supervising physician. Baruch GoutyMelinda Lada, MD [facility] 05/22/2018, 6:11 PM

## 2018-07-21 ENCOUNTER — Encounter: Payer: Self-pay | Admitting: Family Medicine

## 2018-07-21 ENCOUNTER — Other Ambulatory Visit: Payer: Self-pay | Admitting: Family Medicine

## 2018-07-21 DIAGNOSIS — B001 Herpesviral vesicular dermatitis: Secondary | ICD-10-CM

## 2018-07-25 ENCOUNTER — Other Ambulatory Visit: Payer: Self-pay | Admitting: Nurse Practitioner

## 2018-07-25 ENCOUNTER — Other Ambulatory Visit: Payer: Self-pay | Admitting: Family Medicine

## 2018-07-25 DIAGNOSIS — B001 Herpesviral vesicular dermatitis: Secondary | ICD-10-CM

## 2018-07-25 MED ORDER — VALACYCLOVIR HCL 1 G PO TABS: 1000.0000 mg | ORAL_TABLET | Freq: Two times a day (BID) | ORAL | 0 refills | Status: DC

## 2018-07-25 NOTE — Telephone Encounter (Signed)
Refill request for general medication: Valtrex 1000 mg  Last office visit: 05/22/2018  Last physical exam: 12/19/2017  Follow up visit: None indicated

## 2018-07-25 NOTE — Telephone Encounter (Signed)
Refill request for general medication. Valtrex   Last office visit 05/22/2018   No follow-ups on file.

## 2018-11-26 ENCOUNTER — Ambulatory Visit
Admission: EM | Admit: 2018-11-26 | Discharge: 2018-11-26 | Disposition: A | Discharge status: Home / Self Care | Payer: BLUE CROSS/BLUE SHIELD | Attending: Family Medicine | Admitting: Family Medicine

## 2018-11-26 ENCOUNTER — Other Ambulatory Visit: Payer: Self-pay

## 2018-11-26 DIAGNOSIS — L03031 Cellulitis of right toe: Secondary | ICD-10-CM

## 2018-11-26 DIAGNOSIS — M79674 Pain in right toe(s): Secondary | ICD-10-CM | POA: Diagnosis not present

## 2018-11-26 HISTORY — DX: Herpesviral infection, unspecified: B00.9

## 2018-11-26 MED ORDER — DOXYCYCLINE HYCLATE 100 MG PO CAPS: 100.0000 mg | ORAL_CAPSULE | Freq: Two times a day (BID) | ORAL | 0 refills | Status: DC

## 2018-11-26 MED ORDER — MUPIROCIN 2 % EX OINT: 1.0000 "application " | TOPICAL_OINTMENT | Freq: Two times a day (BID) | CUTANEOUS | 0 refills | Status: AC

## 2018-11-26 NOTE — ED Provider Notes (Signed)
[facility]    CSN: [id number] Arrival date & time: 11/26/18  1244  History   Chief Complaint Chief Complaint  Patient presents with  . Toe Pain   HPI  28 year old male presents with right great toe pain.  Patient reports that he believes his right great toe is infected.  Has been bothering him since Thursday.  He has minimal pain currently.  No drainage.  There is a collection of pus noted around the nailbed.  No medications or interventions tried.  No known exacerbating factors.  No other associated symptoms.  No other complaints.  History reviewed and updated as below. Past Medical History:  Diagnosis Date  . Depression   . Herpes simplex    Patient Active Problem List   Diagnosis Date Noted  . Fever blister 07/19/2017  . History of depression 01/27/2016  . Morbid obesity (HCC) 01/27/2016   Home Medications    Prior to Admission medications   Medication Sig Start Date End Date Taking? Authorizing Provider  doxycycline (VIBRAMYCIN) 100 MG capsule Take 1 capsule (100 mg total) by mouth 2 (two) times daily. 11/26/18   Tommie Sams, DO  mupirocin ointment (BACTROBAN) 2 % Apply 1 application topically 2 (two) times daily for 7 days. 11/26/18 12/03/18  Tommie Sams, DO  valACYclovir (VALTREX) 1000 MG tablet Take 1 tablet (1,000 mg total) by mouth 2 (two) times daily. Prn outbreak 07/25/18   Cheryle Horsfall, NP    Family History Family History  Problem Relation Age of Onset  . Depression Mother   . Obesity Mother   . Obesity Father   . Obesity Sister   . ADD / ADHD Brother   . Obesity Brother     Social History Social History   Tobacco Use  . Smoking status: Never Smoker  . Smokeless tobacco: Never Used  Substance Use Topics  . Alcohol use: Yes    Alcohol/week: 1.0 standard drinks    Types: 1 Standard drinks or equivalent per week    Comment: social drinker  . Drug use: No     Allergies   Patient has no known allergies.  Review of  Systems Review of Systems  Constitutional: Negative.   Skin:       Right great toe infection.   Physical Exam Triage Vital Signs ED Triage Vitals  Enc Vitals Group     BP 11/26/18 1316 (!) 147/85     Pulse Rate 11/26/18 1316 81     Resp 11/26/18 1316 18     Temp 11/26/18 1316 98.1 F (36.7 C)     Temp Source 11/26/18 1316 Oral     SpO2 11/26/18 1316 98 %     Weight 11/26/18 1317 (!) 320 lb (145.2 kg)     Height 11/26/18 1317 5\' 7"  (1.702 m)     Head Circumference --      Peak Flow --      Pain Score 11/26/18 1317 1     Pain Loc --      Pain Edu? --      Excl. in GC? --    Updated Vital Signs BP (!) 147/85 (BP Location: Left Arm)   Pulse 81   Temp 98.1 F (36.7 C) (Oral)   Resp 18   Ht 5\' 7"  (1.702 m)   Wt (!) 145.2 kg   SpO2 98%   BMI 50.12 kg/m   Visual Acuity Right Eye Distance:   Left Eye Distance:   Bilateral Distance:  Right Eye Near:   Left Eye Near:    Bilateral Near:     Physical Exam Vitals signs and nursing note reviewed.  Constitutional:      General: He is not in acute distress. HENT:     Head: Normocephalic and atraumatic.     Nose: Nose normal.  Eyes:     General: No scleral icterus.    Conjunctiva/sclera: Conjunctivae normal.  Pulmonary:     Effort: Pulmonary effort is normal. No respiratory distress.  Skin:    Comments: Right great toe -acute paronychia noted.  No current drainage.  No purulence and fluctuance noted.  Neurological:     Mental Status: He is alert.  Psychiatric:        Mood and Affect: Mood normal.        Behavior: Behavior normal.    UC Treatments / Results  Labs (all labs ordered are listed, but only abnormal results are displayed) Labs Reviewed - No data to display  EKG None  Radiology No results found.  Procedures Procedures (including critical care time) Incision and drainage right great toe paronychia  Area was cleansed with Betadine x2.  Cold spray (Pain Ease) used for anesthesia.  Small  stab incision with a #11 scalpel was performed.  Purulent drainage.  Moderate.  Patient tolerated the procedure well without complications.  Wound was dressed -ointment followed by nonstick dressing and Coban.  Medications Ordered in UC Medications - No data to display  Initial Impression / Assessment and Plan / UC Course  I have reviewed the triage vital signs and the nursing notes.  Pertinent labs & imaging results that were available during my care of the patient were reviewed by me and considered in my medical decision making (see chart for details).    28 year old male presents with acute paronychia.  Incision and drainage performed today.  Placing on Bactroban ointment and doxycycline.  Final Clinical Impressions(s) / UC Diagnoses   Final diagnoses:  Paronychia of great toe, right   Discharge Instructions   None    ED Prescriptions    Medication Sig Dispense Auth. Provider   doxycycline (VIBRAMYCIN) 100 MG capsule Take 1 capsule (100 mg total) by mouth 2 (two) times daily. 14 capsule Cyndi Montejano G, DO   mupirocin ointment (BACTROBAN) 2 % Apply 1 application topically 2 (two) times daily for 7 days. 22 g Tommie Sams, DO     Controlled Substance Prescriptions [place] Controlled Substance Registry consulted? Not Applicable   Tommie Sams, DO 11/26/18 1500

## 2018-11-26 NOTE — ED Triage Notes (Addendum)
Right foot great toe "infected" since Thursday. Paronychia

## 2019-02-13 ENCOUNTER — Other Ambulatory Visit: Payer: Self-pay

## 2019-02-13 ENCOUNTER — Ambulatory Visit: Payer: BLUE CROSS/BLUE SHIELD | Admitting: Family Medicine

## 2019-02-13 ENCOUNTER — Encounter: Payer: Self-pay | Admitting: Family Medicine

## 2019-02-13 DIAGNOSIS — Z13220 Encounter for screening for lipoid disorders: Secondary | ICD-10-CM | POA: Diagnosis not present

## 2019-02-13 DIAGNOSIS — Z131 Encounter for screening for diabetes mellitus: Secondary | ICD-10-CM | POA: Diagnosis not present

## 2019-02-13 DIAGNOSIS — R350 Frequency of micturition: Secondary | ICD-10-CM

## 2019-02-13 DIAGNOSIS — R030 Elevated blood-pressure reading, without diagnosis of hypertension: Secondary | ICD-10-CM

## 2019-02-13 DIAGNOSIS — N3944 Nocturnal enuresis: Secondary | ICD-10-CM | POA: Diagnosis not present

## 2019-02-13 DIAGNOSIS — R0683 Snoring: Secondary | ICD-10-CM

## 2019-02-13 DIAGNOSIS — R631 Polydipsia: Secondary | ICD-10-CM | POA: Diagnosis not present

## 2019-02-13 LAB — POCT URINALYSIS DIPSTICK
Appearance: NORMAL
Bilirubin, UA: NEGATIVE
Blood, UA: NEGATIVE
Glucose, UA: NEGATIVE
KETONES UA: NEGATIVE
Leukocytes, UA: NEGATIVE
Nitrite, UA: NEGATIVE
Odor: NORMAL
Protein, UA: NEGATIVE
Spec Grav, UA: 1.01 (ref 1.010–1.025)
Urobilinogen, UA: 0.2 E.U./dL
pH, UA: 7.5 (ref 5.0–8.0)

## 2019-02-13 LAB — POCT CBG (FASTING - GLUCOSE)-MANUAL ENTRY: Glucose Fasting, POC: 69 mg/dL — AB (ref 70–99)

## 2019-02-13 LAB — POCT GLYCOSYLATED HEMOGLOBIN (HGB A1C): Hemoglobin A1C: 5.4 % (ref 4.0–5.6)

## 2019-02-13 NOTE — Progress Notes (Signed)
Name: Derek Khan   MRN: [id number]    DOB: September 16, 1991   Date:02/13/2019       Progress Note  Subjective  Chief Complaint  Chief Complaint  Patient presents with  . Nocturnal Enuresis  . Polydipsia  . Urinary Frequency    HPI  Nocturnal enuresis: he states symptoms started about 3-6 months ago. Gradual onset. Initially just feeling very thirsty. He states drinks 32 ounces of water/gatorade while at work, when he gets home he feels very thirsty and drinks 48 more ounces before bed. He also noticed increase in urinary, no dysuria, urine is normal in color. He had three episodes of nocturnal enuresis , he wakes up and is all wet. Last accident about 2 weeks ago when wife asked him to void prior to going to bed. He snores loudly at night and is a heavy sleeper. He was 28 years old when he stopped having nocturnal enuresis, he was seen by an urologist at the time.    Patient Active Problem List   Diagnosis Date Noted  . Fever blister 07/19/2017  . History of depression 01/27/2016  . Morbid obesity (HCC) 01/27/2016    History reviewed. No pertinent surgical history.  Family History  Problem Relation Age of Onset  . Depression Mother   . Obesity Mother   . Obesity Father   . Obesity Sister   . ADD / ADHD Brother   . Obesity Brother     Social History   Socioeconomic History  . Marital status: Married    Spouse name: Lesly Rubenstein  . Number of children: 0  . Years of education: Not on file  . Highest education level: Some college, no degree  Occupational History  . Occupation: Database administrator     Employer: [employer]    Comment: physical job  Social Needs  . Financial resource strain: Not very hard  . Food insecurity:    Worry: Never true    Inability: Never true  . Transportation needs:    Medical: No    Non-medical: No  Tobacco Use  . Smoking status: Never Smoker  . Smokeless tobacco: Never Used  Substance and Sexual Activity  . Alcohol use: Yes    Alcohol/week:  1.0 standard drinks    Types: 1 Standard drinks or equivalent per week    Comment: social drinker  . Drug use: No  . Sexual activity: Yes    Partners: Female    Birth control/protection: Other-see comments    Comment: wife takes opc  Lifestyle  . Physical activity:    Days per week: 0 days    Minutes per session: 0 min  . Stress: Not at all  Relationships  . Social connections:    Talks on phone: More than three times a week    Gets together: Once a week    Attends religious service: Never    Active member of club or organization: No    Attends meetings of clubs or organizations: Never    Relationship status: Married  . Intimate partner violence:    Fear of current or ex partner: No    Emotionally abused: No    Physically abused: No    Forced sexual activity: No  Other Topics Concern  . 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  valACYclovir (VALTREX) 1000 MG tablet, Take 1  tablet (1,000 mg total) by mouth 2 (two) times daily. Prn outbreak, Disp: 30 tablet, Rfl: 0 .  doxycycline (VIBRAMYCIN) 100 MG capsule, Take 1 capsule (100 mg total) by mouth 2 (two) times daily. (Patient not taking: Reported on 02/13/2019), Disp: 14 capsule, Rfl: 0  No Known Allergies  I personally reviewed active problem list, medication list, allergies, family history, social history with the patient/caregiver today.   ROS  Constitutional: Negative for fever, positive for  weight change.  Respiratory: Negative for cough and shortness of breath.   Cardiovascular: Negative for chest pain or palpitations.  Gastrointestinal: Negative for abdominal pain, no bowel changes.  Musculoskeletal: Negative for gait problem or joint swelling.  Skin: Negative for rash.  Neurological: Negative for dizziness or headache.  No other specific complaints in a complete review of systems  (except as listed in HPI above).  Objective  Vitals:   02/13/19 1056  BP: 130/90  Pulse: 84  Resp: 16  Temp: 97.7 F (36.5 C)  TempSrc: Oral  SpO2: 97%  Weight: (!) 318 lb 6.4 oz (144.4 kg)  Height: 5\' 7"  (1.702 m)    Body mass index is 49.87 kg/m.  Physical Exam  Constitutional: Patient appears well-developed and well-nourished. Obese  No distress.  HEENT: head atraumatic, normocephalic, pupils equal and reactive to light, neck supple, throat within normal limits Cardiovascular: Normal rate, regular rhythm and normal heart sounds.  No murmur heard. No BLE edema. Pulmonary/Chest: Effort normal and breath sounds normal. No respiratory distress. Abdominal: Soft.  There is no tenderness. Neurologist : normal exam Psychiatric: Patient has a normal mood and affect. behavior is normal. Judgment and thought content normal.  PHQ2/9: Depression screen [facility] 2/9 05/22/2018 12/19/2017 01/27/2016  Decreased Interest 0 0 0  Down, Depressed, Hopeless 0 0 0  PHQ - 2 Score 0 0 0  Altered sleeping 2 - -  Tired, decreased energy 1 - -  Change in appetite 1 - -  Feeling bad or failure about yourself  0 - -  Trouble concentrating 0 - -  Moving slowly or fidgety/restless 0 - -  Suicidal thoughts 0 - -  PHQ-9 Score 4 - -  Difficult doing work/chores Not difficult at all - -     Fall Risk: Fall Risk  05/22/2018 12/19/2017 01/27/2016  Falls in the past year? No No No      Assessment & Plan  1. Morbid obesity (HCC)  - Insulin, Free (Bioactive)  2. Nocturnal enuresis  - COMPLETE METABOLIC PANEL WITH GFR - CBC with Differential/Platelet  3. Polydipsia  - COMPLETE METABOLIC PANEL WITH GFR - CBC with Differential/Platelet Possible diabetes insipidus depending on results we will refer him to Nephrologist for further evaluation   4. Lipid screening  - Lipid panel  5. Diabetes mellitus screening  - POCT HgB A1C - POCT CBG (Fasting - Glucose)   6. Snoring  ESS: 14, we will order  sleep study   7. Elevated BP without diagnosis of hypertension  We will monitor for now

## 2019-02-17 LAB — COMPLETE METABOLIC PANEL WITH GFR
AG RATIO: 1.9 (calc) (ref 1.0–2.5)
ALKALINE PHOSPHATASE (APISO): 74 U/L (ref 36–130)
ALT: 23 U/L (ref 9–46)
AST: 19 U/L (ref 10–40)
Albumin: 4.8 g/dL (ref 3.6–5.1)
BILIRUBIN TOTAL: 0.5 mg/dL (ref 0.2–1.2)
BUN: 11 mg/dL (ref 7–25)
CALCIUM: 9.9 mg/dL (ref 8.6–10.3)
CHLORIDE: 105 mmol/L (ref 98–110)
CO2: 26 mmol/L (ref 20–32)
Creat: 0.72 mg/dL (ref 0.60–1.35)
GFR, Est African American: 149 mL/min/{1.73_m2} (ref >=60)
GFR, Est Non African American: 129 mL/min/{1.73_m2} (ref >=60)
GLOBULIN: 2.5 g/dL (ref 1.9–3.7)
Glucose, Bld: 77 mg/dL (ref 65–99)
POTASSIUM: 4.7 mmol/L (ref 3.5–5.3)
SODIUM: 139 mmol/L (ref 135–146)
Total Protein: 7.3 g/dL (ref 6.1–8.1)

## 2019-02-17 LAB — LIPID PANEL
CHOL/HDL RATIO: 3.8 (calc) (ref <5.0)
CHOLESTEROL: 181 mg/dL (ref <200)
HDL: 48 mg/dL (ref >=40)
LDL CHOLESTEROL (CALC): 112 mg/dL — AB
NON-HDL CHOLESTEROL (CALC): 133 mg/dL — AB (ref <130)
Triglycerides: 106 mg/dL (ref <150)

## 2019-02-17 LAB — CBC WITH DIFFERENTIAL/PLATELET
Absolute Monocytes: 554 cells/uL (ref 200–950)
BASOS PCT: 0.4 %
Basophils Absolute: 29 cells/uL (ref 0–200)
EOS ABS: 151 {cells}/uL (ref 15–500)
EOS PCT: 2.1 %
HCT: 43.3 % (ref 38.5–50.0)
HEMOGLOBIN: 14.5 g/dL (ref 13.2–17.1)
Lymphs Abs: 2174 cells/uL (ref 850–3900)
MCH: 26.1 pg — ABNORMAL LOW (ref 27.0–33.0)
MCHC: 33.5 g/dL (ref 32.0–36.0)
MCV: 78 fL — ABNORMAL LOW (ref 80.0–100.0)
MONOS PCT: 7.7 %
MPV: 11.2 fL (ref 7.5–12.5)
NEUTROS ABS: 4291 {cells}/uL (ref 1500–7800)
Neutrophils Relative %: 59.6 %
PLATELETS: 215 10*3/uL (ref 140–400)
RBC: 5.55 10*6/uL (ref 4.20–5.80)
RDW: 13.9 % (ref 11.0–15.0)
Total Lymphocyte: 30.2 %
WBC: 7.2 10*3/uL (ref 3.8–10.8)

## 2019-02-17 LAB — INSULIN, FREE (BIOACTIVE): Insulin, Free: 10.2 u[IU]/mL (ref 1.5–14.9)

## 2019-03-16 ENCOUNTER — Other Ambulatory Visit: Payer: Self-pay

## 2019-03-16 ENCOUNTER — Encounter: Payer: Self-pay | Admitting: Family Medicine

## 2019-03-16 ENCOUNTER — Ambulatory Visit (INDEPENDENT_AMBULATORY_CARE_PROVIDER_SITE_OTHER): Payer: BLUE CROSS/BLUE SHIELD | Admitting: Family Medicine

## 2019-03-16 DIAGNOSIS — N3944 Nocturnal enuresis: Secondary | ICD-10-CM

## 2019-03-16 NOTE — Progress Notes (Signed)
Name: Derek Khan   MRN: [id number]    DOB: 14-Apr-1991   Date:03/16/2019       Progress Note  Subjective  Chief Complaint  Chief Complaint  Patient presents with  . Follow-up    1 month recheck    I connected with  Derek  Khan  on 03/16/19 at  9:20 AM EDT by a video enabled telemedicine application and verified that I am speaking with the correct person using two identifiers.  I discussed the limitations of evaluation and management by telemedicine and the availability of in person appointments. The patient expressed understanding and agreed to proceed. Staff also discussed with the patient that there may be a patient responsible charge related to this service. Patient Location: In car (parked) Provider Location: Home Additional Individuals present: None  HPI  Nocturnal enuresis: he states symptoms started about 4-7 months ago. Gradual onset. Initially just feeling very thirsty. He has changed his fluid intake habits - drinking more throughout the day instead of right before bed. He has not had any episodes or nocturnal enuresis since his visit 1 month ago.  He declines referral to urologist today, is working on setting up sleep study to rule out OSA.  Labs reviewed, kidney function is normal, fasting insulin slightly elevated.  No dysuria, hematuria, flank pain, abdominal pain, or back pain; urine is normal color and no odor.    Patient Active Problem List   Diagnosis Date Noted  . Snoring 02/13/2019  . Nocturnal enuresis 02/13/2019  . Fever blister 07/19/2017  . History of depression 01/27/2016  . Morbid obesity (HCC) 01/27/2016    Social History   Tobacco Use  . Smoking status: Never Smoker  . Smokeless tobacco: Never Used  Substance Use Topics  . Alcohol use: Yes    Alcohol/week: 1.0 standard drinks    Types: 1 Standard drinks or equivalent per week    Comment: social drinker     Current Outpatient Medications:  .  valACYclovir (VALTREX) 1000 MG  tablet, Take 1 tablet (1,000 mg total) by mouth 2 (two) times daily. Prn outbreak, Disp: 30 tablet, Rfl: 0 .  doxycycline (VIBRAMYCIN) 100 MG capsule, Take 1 capsule (100 mg total) by mouth 2 (two) times daily. (Patient not taking: Reported on 02/13/2019), Disp: 14 capsule, Rfl: 0  No Known Allergies  I personally reviewed active problem list, medication list, allergies, notes from last encounter, lab results with the patient/caregiver today.  ROS  Constitutional: Negative for fever or weight change.  Respiratory: Negative for cough and shortness of breath.   Cardiovascular: Negative for chest pain or palpitations.  Gastrointestinal: Negative for abdominal pain, no bowel changes.  Musculoskeletal: Negative for gait problem or joint swelling.  Skin: Negative for rash.  Neurological: Negative for dizziness or headache.  No other specific complaints in a complete review of systems (except as listed in HPI above).  Objective  Virtual encounter, vitals not obtained.  There is no height or weight on file to calculate BMI.  Nursing Note and Vital Signs reviewed.  Physical Exam  Constitutional: Patient appears well-developed and well-nourished. No distress.  HENT: Head: Normocephalic and atraumatic.  Neck: Normal range of motion. Pulmonary/Chest: Effort normal. No respiratory distress. Speaking in complete sentences Neurological: Pt is alert and oriented to person, place, and time. Coordination, speech are normal.  Psychiatric: Patient has a normal mood and affect. behavior is normal. Judgment and thought content normal.  No results found for this or any previous visit (from the  past 72 hour(s)).  Assessment & Plan  1. Nocturnal enuresis - Declines referral today; he will set up sleep study as soon as possible.  - Recommend continuation of reduction in fluids just prior to sleep, and he is doing well with spreading his fluid intake out throughout the day. - No accidents since last  visit, however I did advise him to keep a log of episodes of nocturnal enuresis he has over the next 3 months until his follow up/CPE - if he starts having accidents again, we will refer to urology.  -Red flags and when to present for emergency care or RTC including fever >101.87F, chest pain, shortness of breath, new/worsening/un-resolving symptoms, reviewed with patient at time of visit. Follow up and care instructions discussed and provided in AVS. - I discussed the assessment and treatment plan with the patient. The patient was provided an opportunity to ask questions and all were answered. The patient agreed with the plan and demonstrated an understanding of the instructions.  I provided 15 minutes of non-face-to-face time during this encounter.  Doren CustardEmily E Boyce, FNP

## 2019-07-18 ENCOUNTER — Encounter: Payer: BLUE CROSS/BLUE SHIELD | Admitting: Family Medicine

## 2019-08-17 ENCOUNTER — Other Ambulatory Visit: Payer: Self-pay | Admitting: Family Medicine

## 2019-08-17 ENCOUNTER — Other Ambulatory Visit: Payer: Self-pay

## 2019-08-17 DIAGNOSIS — Z1159 Encounter for screening for other viral diseases: Secondary | ICD-10-CM

## 2019-09-10 ENCOUNTER — Encounter: Payer: Self-pay | Admitting: Family Medicine

## 2019-09-17 ENCOUNTER — Other Ambulatory Visit: Admission: RE | Admit: 2019-09-17 | Payer: BC Managed Care – PPO | Source: Ambulatory Visit

## 2019-09-20 ENCOUNTER — Ambulatory Visit: Payer: BC Managed Care – PPO | Attending: Neurology

## 2020-03-14 ENCOUNTER — Telehealth: Payer: BC Managed Care – PPO | Admitting: Nurse Practitioner

## 2020-03-14 DIAGNOSIS — U071 COVID-19: Secondary | ICD-10-CM

## 2020-03-14 MED ORDER — BENZONATATE 100 MG PO CAPS: 100.0000 mg | ORAL_CAPSULE | Freq: Three times a day (TID) | ORAL | 0 refills | Status: DC | PRN

## 2020-03-14 MED ORDER — AZITHROMYCIN 250 MG PO TABS: ORAL_TABLET | ORAL | 0 refills | Status: DC

## 2020-03-14 NOTE — Progress Notes (Signed)
We are sorry you are not feeling well. We are here to help!  You have tested positive for COVID-19, meaning that you were infected with the novel coronavirus and could give the germ to others.    You have been enrolled in [facility] for COVID-19. Daily you will receive a questionnaire within the [facility] website. Our COVID-19 response team will be monitoring your responses daily.  Please continue isolation at home, for at least 10 days since the start of your symptoms and until you have had 24 hours with no fever (without taking a fever reducer) and with improving of symptoms.  Please continue good preventive care measures, including:  frequent hand-washing, avoid touching your face, cover coughs/sneezes, stay out of crowds and keep a 6 foot distance from others.  Follow up with your provider or go to the nearest hospital ED for re-assessment if fever/cough/breathlessness return.  The following symptoms may appear 2-14 days after exposure: . Fever . Cough . Shortness of breath or difficulty breathing . Chills . Repeated shaking with chills . Muscle pain . Headache . Sore throat . New loss of taste or smell . Fatigue . Congestion or runny nose . Nausea or vomiting . Diarrhea  Go to the nearest hospital ED for assessment if fever/cough/breathlessness are severe or illness seems like a threat to life.  It is vitally important that if you feel that you have an infection such as this virus or any other virus that you stay home and away from places where you may spread it to others.  You should avoid contact with people age 30 and older.   I have prescribed a zpak and tessalon perles  IF YOUR SYMPTOMS WORSEN YOU NEED TO GO TO THE ER  You may also take acetaminophen (Tylenol) as needed for fever.  Reduce your risk of any infection by using the same precautions used for avoiding the common cold or flu:  Marland Kitchen Wash your hands often with soap and warm water for at least 20 seconds.  If  soap and water are not readily available, use an alcohol-based hand sanitizer with at least 60% alcohol.  . If coughing or sneezing, cover your mouth and nose by coughing or sneezing into the elbow areas of your shirt or coat, into a tissue or into your sleeve (not your hands). . Avoid shaking hands with others and consider head nods or verbal greetings only. . Avoid touching your eyes, nose, or mouth with unwashed hands.  . Avoid close contact with people who are sick. . Avoid places or events with large numbers of people in one location, like concerts or sporting events. . Carefully consider travel plans you have or are making. . If you are planning any travel outside or inside the [place], visit the CDC's Travelers' Health webpage for the latest health notices. . If you have some symptoms but not all symptoms, continue to monitor at home and seek medical attention if your symptoms worsen. . If you are having a medical emergency, call 911.  HOME CARE . Only take medications as instructed by your medical team. . Drink plenty of fluids and get plenty of rest. . A steam or ultrasonic humidifier can help if you have congestion.   GET HELP RIGHT AWAY IF YOU HAVE EMERGENCY WARNING SIGNS** FOR COVID-19. If you or someone is showing any of these signs seek emergency medical care immediately. Call 911 or proceed to your closest emergency facility if: . You develop worsening high fever. Marland Kitchen  Trouble breathing . Bluish lips or face . Persistent pain or pressure in the chest . New confusion . Inability to wake or stay awake . You cough up blood. . Your symptoms become more severe  **This list is not all possible symptoms. Contact your medical provider for any symptoms that are sever or concerning to you.  MAKE SURE YOU   Understand these instructions.  Will watch your condition.  Will get help right away if you are not doing well or get worse.  Your e-visit answers were reviewed by a board  certified advanced clinical practitioner to complete your personal care plan.  Depending on the condition, your plan could have included both over the counter or prescription medications.  If there is a problem please reply once you have received a response from your provider.  Your safety is important to [place].  If you have drug allergies check your prescription carefully.    You can use MyChart to ask questions about today's visit, request a non-urgent call back, or ask for a work or school excuse for 24 hours related to this e-Visit. If it has been greater than 24 hours you will need to follow up with your provider, or enter a new e-Visit to address those concerns. You will get an e-mail in the next two days asking about your experience.  I hope that your e-visit has been valuable and will speed your recovery. Thank you for using e-visits.   5-10 minutes spent reviewing and documenting in chart.

## 2020-03-17 ENCOUNTER — Other Ambulatory Visit: Payer: Self-pay

## 2020-03-17 ENCOUNTER — Encounter: Payer: Self-pay | Admitting: Emergency Medicine

## 2020-03-17 ENCOUNTER — Emergency Department: Payer: BC Managed Care – PPO

## 2020-03-17 ENCOUNTER — Inpatient Hospital Stay
Admission: EM | Admit: 2020-03-17 | Discharge: 2020-03-20 | DRG: 177 | Disposition: A | Discharge status: Home / Self Care | Payer: BC Managed Care – PPO | Attending: Hospitalist | Admitting: Hospitalist

## 2020-03-17 DIAGNOSIS — Z818 Family history of other mental and behavioral disorders: Secondary | ICD-10-CM | POA: Diagnosis not present

## 2020-03-17 DIAGNOSIS — Z6841 Body Mass Index (BMI) 40.0 and over, adult: Secondary | ICD-10-CM

## 2020-03-17 DIAGNOSIS — Z8616 Personal history of COVID-19: Secondary | ICD-10-CM | POA: Diagnosis present

## 2020-03-17 DIAGNOSIS — U071 COVID-19: Secondary | ICD-10-CM | POA: Diagnosis not present

## 2020-03-17 DIAGNOSIS — Z8659 Personal history of other mental and behavioral disorders: Secondary | ICD-10-CM

## 2020-03-17 DIAGNOSIS — F329 Major depressive disorder, single episode, unspecified: Secondary | ICD-10-CM | POA: Diagnosis present

## 2020-03-17 DIAGNOSIS — J1282 Pneumonia due to coronavirus disease 2019: Secondary | ICD-10-CM | POA: Diagnosis present

## 2020-03-17 DIAGNOSIS — Z79899 Other long term (current) drug therapy: Secondary | ICD-10-CM

## 2020-03-17 DIAGNOSIS — J9601 Acute respiratory failure with hypoxia: Secondary | ICD-10-CM | POA: Diagnosis not present

## 2020-03-17 DIAGNOSIS — B009 Herpesviral infection, unspecified: Secondary | ICD-10-CM

## 2020-03-17 DIAGNOSIS — R7982 Elevated C-reactive protein (CRP): Secondary | ICD-10-CM | POA: Diagnosis present

## 2020-03-17 DIAGNOSIS — B001 Herpesviral vesicular dermatitis: Secondary | ICD-10-CM

## 2020-03-17 LAB — CBC WITH DIFFERENTIAL/PLATELET
Abs Immature Granulocytes: 0.02 10*3/uL (ref 0.00–0.07)
Basophils Absolute: 0 10*3/uL (ref 0.0–0.1)
Basophils Relative: 0 %
Eosinophils Absolute: 0 10*3/uL (ref 0.0–0.5)
Eosinophils Relative: 0 %
HCT: 40.1 % (ref 39.0–52.0)
Hemoglobin: 13.2 g/dL (ref 13.0–17.0)
Immature Granulocytes: 0 %
Lymphocytes Relative: 13 %
Lymphs Abs: 1 10*3/uL (ref 0.7–4.0)
MCH: 25.4 pg — ABNORMAL LOW (ref 26.0–34.0)
MCHC: 32.9 g/dL (ref 30.0–36.0)
MCV: 77.1 fL — ABNORMAL LOW (ref 80.0–100.0)
Monocytes Absolute: 0.7 10*3/uL (ref 0.1–1.0)
Monocytes Relative: 9 %
Neutro Abs: 5.9 10*3/uL (ref 1.7–7.7)
Neutrophils Relative %: 78 %
Platelets: 174 10*3/uL (ref 150–400)
RBC: 5.2 MIL/uL (ref 4.22–5.81)
RDW: 13.8 % (ref 11.5–15.5)
WBC: 7.6 10*3/uL (ref 4.0–10.5)
nRBC: 0 % (ref 0.0–0.2)

## 2020-03-17 LAB — COMPREHENSIVE METABOLIC PANEL
ALT: 37 U/L (ref 0–44)
AST: 32 U/L (ref 15–41)
Albumin: 3.6 g/dL (ref 3.5–5.0)
Alkaline Phosphatase: 51 U/L (ref 38–126)
Anion gap: 11 (ref 5–15)
BUN: 9 mg/dL (ref 6–20)
CO2: 25 mmol/L (ref 22–32)
Calcium: 8.8 mg/dL — ABNORMAL LOW (ref 8.9–10.3)
Chloride: 102 mmol/L (ref 98–111)
Creatinine, Ser: 0.81 mg/dL (ref 0.61–1.24)
GFR calc Af Amer: >60 mL/min (ref >60)
GFR calc non Af Amer: >60 mL/min (ref >60)
Glucose, Bld: 99 mg/dL (ref 70–99)
Potassium: 3.8 mmol/L (ref 3.5–5.1)
Sodium: 138 mmol/L (ref 135–145)
Total Bilirubin: 0.7 mg/dL (ref 0.3–1.2)
Total Protein: 7.5 g/dL (ref 6.5–8.1)

## 2020-03-17 LAB — TROPONIN I (HIGH SENSITIVITY): Troponin I (High Sensitivity): 3 ng/L (ref <18)

## 2020-03-17 MED ORDER — SODIUM CHLORIDE 0.9 % IV BOLUS
500.0000 mL | Freq: Once | INTRAVENOUS | Status: AC
  Administered 2020-03-17: 23:00:00 500 mL via INTRAVENOUS

## 2020-03-17 MED ORDER — VALACYCLOVIR HCL 500 MG PO TABS
1000.0000 mg | ORAL_TABLET | Freq: Two times a day (BID) | ORAL | Status: DC
  Administered 2020-03-18 – 2020-03-20 (×6): 1000 mg via ORAL
  Filled 2020-03-17 (×7): qty 2

## 2020-03-17 MED ORDER — GUAIFENESIN ER 600 MG PO TB12
600.0000 mg | ORAL_TABLET | Freq: Two times a day (BID) | ORAL | Status: DC
  Administered 2020-03-18 – 2020-03-20 (×6): 600 mg via ORAL
  Filled 2020-03-17 (×7): qty 1

## 2020-03-17 MED ORDER — HYDROCOD POLST-CPM POLST ER 10-8 MG/5ML PO SUER: 5.0000 mL | Freq: Two times a day (BID) | ORAL | Status: DC | PRN

## 2020-03-17 MED ORDER — DEXAMETHASONE SODIUM PHOSPHATE 10 MG/ML IJ SOLN
6.0000 mg | Freq: Once | INTRAMUSCULAR | Status: AC
  Administered 2020-03-17: 6 mg via INTRAVENOUS
  Filled 2020-03-17: qty 1

## 2020-03-17 MED ORDER — BENZONATATE 100 MG PO CAPS
100.0000 mg | ORAL_CAPSULE | Freq: Three times a day (TID) | ORAL | Status: DC | PRN
  Administered 2020-03-18 (×2): 100 mg via ORAL
  Filled 2020-03-17 (×2): qty 1

## 2020-03-17 NOTE — H&P (Signed)
[place] [facility]   PATIENT NAME: Derek Khan    MR#:  [id number]  DATE OF BIRTH:  [birth year]  DATE OF ADMISSION:  03/17/2020  PRIMARY CARE PHYSICIAN: Alba Cory, MD   REQUESTING/REFERRING PHYSICIAN: Sharyn Creamer, MD CHIEF COMPLAINT:   Chief Complaint  Patient presents with  . Shortness of Breath    HISTORY OF PRESENT ILLNESS:  Derek Khan  is a 29 y.o. pleasant obese African-American male with a known history of depression and herpes simplex, presented to the emergency room with acute onset of worsening dyspnea with associated cough as well as dyspnea on exertion.  The patient had a positive COVID-19 test on 4/19.  His symptoms started on 416 with dyspnea and cough that has been mainly dry as well as mild wheezing.  He experienced fever and chills that resolved a couple of days ago.  He also experienced loss of taste and smell which have resolved.  He admitted to nausea and vomiting as well as diarrhea which resolved.  No chest pain or palpitations.  No dysuria, oliguria or hematuria or flank pain.  Upon presentation to the emergency room, history rate was 28 and heart rate 118 with otherwise normal vital signs.  CBC and CMP were unremarkable chest x-ray showed bilateral left greater than right infiltrates concerning for multifocal pneumonia. EKG showed sinus tachycardia with a rate of 119 with poor R wave progression.  The patient was given 500 mill IV normal saline bolus and 6 mg of IV Decadron.  He will be admitted to medical monitored isolation bed for further evaluation and management  PAST MEDICAL HISTORY:   Past Medical History:  Diagnosis Date  . Depression   . Herpes simplex     PAST SURGICAL HISTORY:  History reviewed. No pertinent surgical history.  He denies any previous surgeries  SOCIAL HISTORY:   Social History   Tobacco Use  . Smoking status: Never Smoker  . Smokeless tobacco: Never Used  Substance Use Topics  . Alcohol use: Yes     Alcohol/week: 1.0 standard drinks    Types: 1 Standard drinks or equivalent per week    Comment: social drinker    FAMILY HISTORY:   Family History  Problem Relation Age of Onset  . Depression Mother   . Obesity Mother   . Obesity Father   . Obesity Sister   . ADD / ADHD Brother   . Obesity Brother     DRUG ALLERGIES:  No Known Allergies  REVIEW OF SYSTEMS:   ROS As per history of present illness. All pertinent systems were reviewed above. Constitutional,  HEENT, cardiovascular, respiratory, GI, GU, musculoskeletal, neuro, psychiatric, endocrine,  integumentary and hematologic systems were reviewed and are otherwise  negative/unremarkable except for positive findings mentioned above in the HPI.   MEDICATIONS AT HOME:   Prior to Admission medications   Medication Sig Start Date End Date Taking? Authorizing Provider  azithromycin (ZITHROMAX Z-PAK) 250 MG tablet As directed 03/14/20   Daphine Deutscher, Mary-Margaret, FNP  benzonatate (TESSALON PERLES) 100 MG capsule Take 1 capsule (100 mg total) by mouth 3 (three) times daily as needed for cough. 03/14/20   Daphine Deutscher, Mary-Margaret, FNP  doxycycline (VIBRAMYCIN) 100 MG capsule Take 1 capsule (100 mg total) by mouth 2 (two) times daily. Patient not taking: Reported on 02/13/2019 11/26/18   Tommie Sams, DO  valACYclovir (VALTREX) 1000 MG tablet Take 1 tablet (1,000 mg total) by mouth 2 (two) times daily. Prn outbreak 07/25/18  Poulose, Percell Belt, NP      VITAL SIGNS:  Blood pressure 113/71, pulse (!) 102, temperature 98.8 F (37.1 C), temperature source Oral, resp. rate (!) 28, height 5\' 7"  (1.702 m), weight (!) 146.1 kg, SpO2 92 %.  PHYSICAL EXAMINATION:  Physical Exam  GENERAL:  29 y.o.-year-old African-American patient lying in the bed with no acute distress.  EYES: Pupils equal, round, reactive to light and accommodation. No scleral icterus. Extraocular muscles intact.  HEENT: Head atraumatic, normocephalic. Oropharynx and  nasopharynx clear.  NECK:  Supple, no jugular venous distention. No thyroid enlargement, no tenderness.  LUNGS: Diminished bibasal breath sounds with bibasal crackles. CARDIOVASCULAR: Regular rate and rhythm, S1, S2 normal. No murmurs, rubs, or gallops.  ABDOMEN: Soft, nondistended, nontender. Bowel sounds present. No organomegaly or mass.  EXTREMITIES: No pedal edema, cyanosis, or clubbing.  NEUROLOGIC: Cranial nerves II through XII are intact. Muscle strength 5/5 in all extremities. Sensation intact. Gait not checked.  PSYCHIATRIC: The patient is alert and oriented x 3.  Normal affect and good eye contact. SKIN: No obvious rash, lesion, or ulcer.   LABORATORY PANEL:   CBC Recent Labs  Lab 03/17/20 2220  WBC 7.6  HGB 13.2  HCT 40.1  PLT 174   ------------------------------------------------------------------------------------------------------------------  Chemistries  Recent Labs  Lab 03/17/20 2220  NA 138  K 3.8  CL 102  CO2 25  GLUCOSE 99  BUN 9  CREATININE 0.81  CALCIUM 8.8*  AST 32  ALT 37  ALKPHOS 51  BILITOT 0.7   ------------------------------------------------------------------------------------------------------------------  Cardiac Enzymes No results for input(s): TROPONINI in the last 168 hours. ------------------------------------------------------------------------------------------------------------------  RADIOLOGY:  DG Chest 2 View  Result Date: 03/17/2020 CLINICAL DATA:  Pt presents to ED via EMS with worsening shortness of breathing over the past 4 days. Dx with COVID last Monday with dry cough, fever, and headache. EXAM: CHEST - 2 VIEW COMPARISON:  None. FINDINGS: The heart size and mediastinal contours are within normal limits. Low lung volumes. There are bilateral left greater than right infiltrates, predominantly involving the mid to lower lung. No pneumothorax. No pleural effusion. No acute finding in the visualized skeleton. IMPRESSION:  Bilateral left greater than right infiltrates concerning for multifocal pneumonia. Electronically Signed   By: Tuesday M.D.   On: 03/17/2020 20:04      IMPRESSION AND PLAN:   1.  Acute hypoxemic respiratory failure secondary to COVID-19. -The patient will be admitted to a medically monitored isolation bed. -O2 protocol will be followed to keep O2 saturation above 93.   2.  Multifocal pneumonia secondary to COVID-19. -The patient will be admitted to an isolation monitored bed with droplet and contact precautions. -He will be placed on scheduled Mucinex and as needed Tussionex. -We will follow CRP, ferritin, LDH and D-dimer. -Will follow manual differential for ANC/ALC ratio as well as follow troponin I and daily CBC with manual differential and CMP. - Will place the patient on IV Remdesivir and IV steroid therapy with Decadron with elevated inflammatory markers. -We will start him on p.o. Ivermectin at 16 mg p.o. daily that is a lower dose relative to his weight. -The patient will be placed on vitamin D3, vitamin C, zinc sulfate, p.o. Pepcid and aspirin. -Actemra can be considered for CRP more than 7 with associated hypoxemia.  3.  Herpes simplex. -We will continue his Valtrex.  4.  History of depression. -This has apparently resolved.  He is on no medications for it.  4.  DVT prophylaxis. -Subcutaneous  Lovenox.   All the records are reviewed and case discussed with ED provider. The plan of care was discussed in details with the patient. I answered all questions. The patient agreed to proceed with the above mentioned plan. Further management will depend upon hospital course.   CODE STATUS: Full code  Status is: Inpatient  Remains inpatient appropriate because:Unsafe d/c plan, IV treatments appropriate due to intensity of illness or inability to take PO and Inpatient level of care appropriate due to severity of illness   Dispo: The patient is from: Home               Anticipated d/c is to: Home              Anticipated d/c date is: 2 days              Patient currently is not medically stable to d/c.   TOTAL TIME TAKING CARE OF THIS PATIENT: 55 minutes.    Christel Mormon M.D on 03/17/2020 at 11:37 PM  [facility]   From 7 PM-7 AM, contact night-coverage [email or web address]  CC: Primary care physician; Steele Sizer, MD   Note: This dictation was prepared with Dragon dictation along with smaller phrase technology. Any transcriptional errors that result from this process are unintentional.

## 2020-03-17 NOTE — ED Provider Notes (Signed)
[facility] Emergency Department Provider Note ____________________________________________   First MD Initiated Contact with Patient 03/17/20 2208     (approximate)  I have reviewed the triage vital signs and the nursing notes.  HISTORY  Chief Complaint Shortness of Breath  HPI Derek Khan is a 29 y.o. 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  He does not have a copy of the test, but does tell me tested positive.  The been doing fairly well with some aches and fatigue, but about 4 days ago started developing shortness of breath that seems to be slowly worsening to the point now when he gets up and walks short distances become very short of breath.  Still having some cough nonproductive.  Fatigue.    Past Medical History:  Diagnosis Date  . Depression   . Herpes simplex     Patient Active Problem List   Diagnosis Date Noted  . Snoring 02/13/2019  . Nocturnal enuresis 02/13/2019  . Fever blister 07/19/2017  . History of depression 01/27/2016  . Morbid obesity ([place]) 01/27/2016    History reviewed. No pertinent surgical history.  Prior to Admission medications   Medication Sig Start Date End Date Taking? Authorizing Provider  azithromycin (ZITHROMAX Z-PAK) 250 MG tablet As directed 03/14/20   Hassell Done, Mary-Margaret, FNP  benzonatate (TESSALON PERLES) 100 MG capsule Take 1 capsule (100 mg total) by mouth 3 (three) times daily as needed for cough. 03/14/20   Hassell Done, Mary-Margaret, FNP  doxycycline (VIBRAMYCIN) 100 MG capsule Take 1 capsule (100 mg total) by mouth 2 (two) times daily. Patient not taking: Reported on 02/13/2019 11/26/18   Coral Spikes, DO  valACYclovir (VALTREX) 1000 MG tablet Take  1 tablet (1,000 mg total) by mouth 2 (two) times daily. Prn outbreak 07/25/18   Fredderick Severance, NP    Allergies Patient has no known allergies.  Family History  Problem Relation Age of Onset  . Depression Mother   . Obesity Mother   . Obesity Father   . Obesity Sister   . ADD / ADHD Brother   . Obesity Brother     Social History Social History   Tobacco Use  . Smoking status: Never Smoker  . Smokeless tobacco: Never Used  Substance Use Topics  . Alcohol use: Yes    Alcohol/week: 1.0 standard drinks    Types: 1 Standard drinks or equivalent per week    Comment: social drinker  . Drug use: No    Review of Systems Constitutional: See HPI Eyes: No visual changes. ENT: No sore throat. Cardiovascular: Denies chest pain. Respiratory: See HPI gastrointestinal: No abdominal pain.   Genitourinary: Negative for dysuria. Musculoskeletal: Negative for back pain.  No swelling.  Some muscle aches generalized fatigue Skin: Negative for rash. Neurological: Negative for numbness.    ____________________________________________   PHYSICAL EXAM:  VITAL SIGNS: ED Triage Vitals  Enc Vitals Group     BP 03/17/20 1941 124/73     Pulse Rate 03/17/20 1941 (!) 118     Resp 03/17/20 1941 (!) 28     Temp 03/17/20 1941 98.8 F (37.1 C)     Temp Source 03/17/20 1941 Oral     SpO2 03/17/20 1941 92 %  Weight 03/17/20 1942 (!) 322 lb (146.1 kg)     Height 03/17/20 1942 5\' 7"  (1.702 m)     Head Circumference --      Peak Flow --      Pain Score 03/17/20 1942 0     Pain Loc --      Pain Edu? --      Excl. in GC? --     Constitutional: Alert and oriented.  Slightly dyspneic.  Saturating high 90s on 3 L nasal cannula Eyes: Conjunctivae are normal. Head: Atraumatic. Nose: No congestion/rhinnorhea. Mouth/Throat: Patient wearing mask Neck: No stridor.  Cardiovascular: Minimally tachycardic rate, regular rhythm. Grossly normal heart sounds.  Good peripheral  circulation. Respiratory: N mildly tachypnea no retractions. Lungs CTAB. Gastrointestinal: Soft and nontender. No distention. Musculoskeletal: No noted edema Neurologic:  Normal speech and language. No gross focal neurologic deficits are appreciated.  Skin:  Skin is warm, dry and intact. No rash noted. Psychiatric: Mood and affect are normal. Speech and behavior are normal.  ____________________________________________   LABS (all labs ordered are listed, but only abnormal results are displayed)  Labs Reviewed  CBC WITH DIFFERENTIAL/PLATELET - Abnormal; Notable for the following components:      Result Value   MCV 77.1 (*)    MCH 25.4 (*)    All other components within normal limits  COMPREHENSIVE METABOLIC PANEL - Abnormal; Notable for the following components:   Calcium 8.8 (*)    All other components within normal limits  RESPIRATORY PANEL BY RT PCR (FLU A&B, COVID)  CULTURE, BLOOD (ROUTINE X 2)  CULTURE, BLOOD (ROUTINE X 2)  PROCALCITONIN  TROPONIN I (HIGH SENSITIVITY)  TROPONIN I (HIGH SENSITIVITY)   ____________________________________________  EKG  Reviewed inter by me at 1940 Heart rate 120 QRS 80 QTc 440 Sinus tachycardia, nonspecific T wave abnormality. ____________________________________________  RADIOLOGY  DG Chest 2 View  Result Date: 03/17/2020 CLINICAL DATA:  Pt presents to ED via EMS with worsening shortness of breathing over the past 4 days. Dx with COVID last Monday with dry cough, fever, and headache. EXAM: CHEST - 2 VIEW COMPARISON:  None. FINDINGS: The heart size and mediastinal contours are within normal limits. Low lung volumes. There are bilateral left greater than right infiltrates, predominantly involving the mid to lower lung. No pneumothorax. No pleural effusion. No acute finding in the visualized skeleton. IMPRESSION: Bilateral left greater than right infiltrates concerning for multifocal pneumonia. Electronically Signed   By: Friday M.D.   On: 03/17/2020 20:04    Chest x-ray reviewed, concerning for multifocal infiltrates ____________________________________________   PROCEDURES  Procedure(s) performed: 3-lead  Procedures  Critical Care performed: No  ____________________________________________   INITIAL IMPRESSION / ASSESSMENT AND PLAN / ED COURSE  Pertinent labs & imaging results that were available during my care of the patient were reviewed by me and considered in my medical decision making (see chart for details).   Patient was for evaluation for cough, symptoms worsening over about the last 10 or so days slightly more, reports positive diagnosis of Covid at a local clinic.  Patient does not have confirmatory lab, but certainly assures me that he has had 2+ test and was informed of positive diagnosis.  Clinical history and symptoms seem to match that of COVID-19, his white count is normal, currently afebrile but is hypoxic requiring 4 L nasal cannula with only mild increased work of breathing and relief of dyspnea thereafter.  I will send procalcitonin, and defer to hospitalist service for  initiation of potential Covid specific medications and treatment.  At this point it does seem that he is most likely suffering from COVID-19, will send however confirmatory testing here  Patient case, admission discussed hospitalist Dr. Arville Care; requests I start first dose remdesivir and Decadron as we await the admission evaluation  Patient understanding , agreeable with plan for admission      ____________________________________________   FINAL CLINICAL IMPRESSION(S) / ED DIAGNOSES  Final diagnoses:  COVID-19        Note:  This document was prepared using Dragon voice recognition software and may include unintentional dictation errors       Sharyn Creamer, MD 03/17/20 2323

## 2020-03-17 NOTE — ED Notes (Signed)
See triage note, pt reports recent covid dx on Monday and [facility] with exertion. Pt 93% on 4L. Normally RA at home. Reports dry cough, denies fevers.  Breathing unlabored at this time. Denies CP.  Call bell within reach. Stretcher locked in lowest poisiton.

## 2020-03-17 NOTE — ED Triage Notes (Addendum)
Pt presents to ED via EMS with worsening shortness of breathing over the past 4 days. Dx with COVID last Monday with dry cough, fever, and headache. EMS report pt O2 saturation of 94 on room air that improved to 98% on 2L. O2 drops with exertion. Increased respiratory rate noted with occasional wet sounding cough.

## 2020-03-17 NOTE — ED Notes (Signed)
ED TO INPATIENT HANDOFF REPORT  ED Nurse Name and Phone #: [place] Levels Name/Age/Gender Derek Khan 29 y.o. male Room/Bed: ED06A/ED06A  Code Status   Code Status: Not on file  Home/SNF/Other Home Patient oriented to: self, place, time and situation Is this baseline? Yes   Triage Complete: Triage complete  Chief Complaint Acute hypoxemic respiratory failure due to COVID-19 ([place]) [U07.1, J96.01]  Triage Note Pt presents to ED via EMS with worsening shortness of breathing over the past 4 days. Dx with COVID last Monday with dry cough, fever, and headache. EMS report pt O2 saturation of 94 on room air that improved to 98% on 2L. O2 drops with exertion. Increased respiratory rate noted with occasional wet sounding cough.     Allergies No Known Allergies  Level of Care/Admitting Diagnosis ED Disposition    ED Disposition Condition [facility] Area: [place] [[id number]]  Level of Care: Med-Surg [16]  Covid Evaluation: Confirmed COVID Positive  Diagnosis: Acute hypoxemic respiratory failure due to COVID-19 [facility]) [[id number]]  Admitting Physician: Christel Mormon [[id number]]  Attending Physician: Christel Mormon [[id number]]  Estimated length of stay: past midnight tomorrow  Certification:: I certify this patient will need inpatient services for at least 2 midnights       B Medical/Surgery History Past Medical History:  Diagnosis Date  . Depression   . Herpes simplex    History reviewed. No pertinent surgical history.   A IV Location/Drains/Wounds Patient Lines/Drains/Airways Status   Active Line/Drains/Airways    Name:   Placement date:   Placement time:   Site:   Days:   Peripheral IV 03/17/20 Left Antecubital   03/17/20    2225    Antecubital   1   Peripheral IV 03/17/20 Right Antecubital   03/17/20    2340    Antecubital   1          Intake/Output Last 24 hours No intake or output data in the 24 hours ending 03/18/20  0000  Labs/Imaging Results for orders placed or performed during the hospital encounter of 03/17/20 (from the past 48 hour(s))  Troponin I (High Sensitivity)     Status: None   Collection Time: 03/17/20 10:20 PM  Result Value Ref Range   Troponin I (High Sensitivity) 3 <18 ng/L    Comment: (NOTE) Elevated high sensitivity troponin I (hsTnI) values and significant  changes across serial measurements may suggest ACS but many other  chronic and acute conditions are known to elevate hsTnI results.  Refer to the "Links" section for chest pain algorithms and additional  guidance. Performed at [facility], [address] [place], [place] [address]   CBC with Differential     Status: Abnormal   Collection Time: 03/17/20 10:20 PM  Result Value Ref Range   WBC 7.6 4.0 - 10.5 K/uL   RBC 5.20 4.22 - 5.81 MIL/uL   Hemoglobin 13.2 13.0 - 17.0 g/dL   HCT 40.1 39.0 - 52.0 %   MCV 77.1 (L) 80.0 - 100.0 fL   MCH 25.4 (L) 26.0 - 34.0 pg   MCHC 32.9 30.0 - 36.0 g/dL   RDW 13.8 11.5 - 15.5 %   Platelets 174 150 - 400 K/uL   nRBC 0.0 0.0 - 0.2 %   Neutrophils Relative % 78 %   Neutro Abs 5.9 1.7 - 7.7 K/uL   Lymphocytes Relative 13 %   Lymphs Abs 1.0 0.7 - 4.0 K/uL  Monocytes Relative 9 %   Monocytes Absolute 0.7 0.1 - 1.0 K/uL   Eosinophils Relative 0 %   Eosinophils Absolute 0.0 0.0 - 0.5 K/uL   Basophils Relative 0 %   Basophils Absolute 0.0 0.0 - 0.1 K/uL   Immature Granulocytes 0 %   Abs Immature Granulocytes 0.02 0.00 - 0.07 K/uL    Comment: Performed at [facility], [address], [place], [place] [address]  Comprehensive metabolic panel     Status: Abnormal   Collection Time: 03/17/20 10:20 PM  Result Value Ref Range   Sodium 138 135 - 145 mmol/L   Potassium 3.8 3.5 - 5.1 mmol/L   Chloride 102 98 - 111 mmol/L   CO2 25 22 - 32 mmol/L   Glucose, Bld 99 70 - 99 mg/dL    Comment: Glucose reference range applies only to samples taken after fasting for at least  8 hours.   BUN 9 6 - 20 mg/dL   Creatinine, Ser 6.28 0.61 - 1.24 mg/dL   Calcium 8.8 (L) 8.9 - 10.3 mg/dL   Total Protein 7.5 6.5 - 8.1 g/dL   Albumin 3.6 3.5 - 5.0 g/dL   AST 32 15 - 41 U/L   ALT 37 0 - 44 U/L   Alkaline Phosphatase 51 38 - 126 U/L   Total Bilirubin 0.7 0.3 - 1.2 mg/dL   GFR calc non Af Amer >60 >60 mL/min   GFR calc Af Amer >60 >60 mL/min   Anion gap 11 5 - 15    Comment: Performed at [facility], [address] [place], [place] [address]   DG Chest 2 View  Result Date: 03/17/2020 CLINICAL DATA:  Pt presents to ED via EMS with worsening shortness of breathing over the past 4 days. Dx with COVID last Monday with dry cough, fever, and headache. EXAM: CHEST - 2 VIEW COMPARISON:  None. FINDINGS: The heart size and mediastinal contours are within normal limits. Low lung volumes. There are bilateral left greater than right infiltrates, predominantly involving the mid to lower lung. No pneumothorax. No pleural effusion. No acute finding in the visualized skeleton. IMPRESSION: Bilateral left greater than right infiltrates concerning for multifocal pneumonia. Electronically Signed   By: Emmaline Kluver M.D.   On: 03/17/2020 20:04    Pending Labs Unresulted Labs (From admission, onward)    Start     Ordered   03/17/20 2313  Blood Culture (routine x 2)  BLOOD CULTURE X 2,   STAT     03/17/20 2312   03/17/20 2247  Respiratory Panel by RT PCR (Flu A&B, Covid) - Nasopharyngeal Swab  (Tier 2 Respiratory Panel by RT PCR (Flu A&B, Covid) (TAT 2 hrs))  Once,   STAT    Question Answer Comment  Is this test for diagnosis or screening Diagnosis of ill patient   Symptomatic for COVID-19 as defined by CDC Yes   Date of Symptom Onset 03/07/2020   Hospitalized for COVID-19 No   Admitted to ICU for COVID-19 No   Previously tested for COVID-19 Yes   Resident in a congregate (group) care setting No   Employed in healthcare setting Unknown   Has patient completed COVID  vaccination(s) (2 doses of Pfizer/Moderna 1 dose of [employer]) Unknown      03/17/20 2246   03/17/20 2247  Procalcitonin - Baseline  Add-on,   AD     03/17/20 2247          Vitals/Pain Today's Vitals  03/17/20 1941 03/17/20 1942 03/17/20 2230 03/17/20 2330  BP: 124/73  113/71 127/74  Pulse: (!) 118  (!) 102 (!) 102  Resp: (!) 28     Temp: 98.8 F (37.1 C)     TempSrc: Oral     SpO2: 92%  92% 95%  Weight:  (!) 146.1 kg    Height:  5\' 7"  (1.702 m)    PainSc:  0-No pain      Isolation Precautions Airborne and Contact precautions  Medications Medications  valACYclovir (VALTREX) tablet 1,000 mg (has no administration in time range)  benzonatate (TESSALON) capsule 100 mg (has no administration in time range)  guaiFENesin (MUCINEX) 12 hr tablet 600 mg (has no administration in time range)  chlorpheniramine-HYDROcodone (TUSSIONEX) 10-8 MG/5ML suspension 5 mL (has no administration in time range)  sodium chloride 0.9 % bolus 500 mL (500 mLs Intravenous New Bag/Given 03/17/20 2306)  dexamethasone (DECADRON) injection 6 mg (6 mg Intravenous Given 03/17/20 2344)    Mobility walks Low fall risk   Focused Assessments Pulmonary Assessment Handoff:  Lung sounds:   O2 Device: Nasal Cannula O2 Flow Rate (L/min): 4 L/min      R Recommendations: See Admitting Provider Note  Report given to:   Additional Notes:

## 2020-03-18 ENCOUNTER — Other Ambulatory Visit: Payer: Self-pay

## 2020-03-18 ENCOUNTER — Encounter: Payer: Self-pay | Admitting: Family Medicine

## 2020-03-18 ENCOUNTER — Inpatient Hospital Stay: Payer: BC Managed Care – PPO

## 2020-03-18 LAB — RESPIRATORY PANEL BY RT PCR (FLU A&B, COVID)
Influenza A by PCR: NEGATIVE
Influenza B by PCR: NEGATIVE
SARS Coronavirus 2 by RT PCR: POSITIVE — AB

## 2020-03-18 LAB — FIBRIN DERIVATIVES D-DIMER (ARMC ONLY): Fibrin derivatives D-dimer (ARMC): 1408.52 ng/mL (FEU) — ABNORMAL HIGH (ref 0.00–499.00)

## 2020-03-18 LAB — LACTATE DEHYDROGENASE: LDH: 236 U/L — ABNORMAL HIGH (ref 98–192)

## 2020-03-18 LAB — HIV ANTIBODY (ROUTINE TESTING W REFLEX): HIV Screen 4th Generation wRfx: NONREACTIVE

## 2020-03-18 LAB — FERRITIN: Ferritin: 471 ng/mL — ABNORMAL HIGH (ref 24–336)

## 2020-03-18 LAB — C-REACTIVE PROTEIN: CRP: 16.6 mg/dL — ABNORMAL HIGH (ref <1.0)

## 2020-03-18 LAB — TROPONIN I (HIGH SENSITIVITY): Troponin I (High Sensitivity): 4 ng/L (ref <18)

## 2020-03-18 LAB — PROCALCITONIN: Procalcitonin: <0.1 ng/mL

## 2020-03-18 LAB — ABO/RH: ABO/RH(D): O POS

## 2020-03-18 MED ORDER — TOCILIZUMAB 400 MG/20ML IV SOLN
800.0000 mg | Freq: Once | INTRAVENOUS | Status: AC
  Administered 2020-03-18: 800 mg via INTRAVENOUS
  Filled 2020-03-18: qty 40

## 2020-03-18 MED ORDER — SODIUM CHLORIDE 0.9 % IV SOLN
100.0000 mg | Freq: Every day | INTRAVENOUS | Status: DC
  Filled 2020-03-18: qty 20

## 2020-03-18 MED ORDER — HYDROCOD POLST-CPM POLST ER 10-8 MG/5ML PO SUER: 5.0000 mL | Freq: Two times a day (BID) | ORAL | Status: DC | PRN

## 2020-03-18 MED ORDER — VITAMIN D 25 MCG (1000 UNIT) PO TABS
1000.0000 [IU] | ORAL_TABLET | Freq: Every day | ORAL | Status: DC
  Administered 2020-03-18 – 2020-03-20 (×3): 1000 [IU] via ORAL
  Filled 2020-03-18 (×3): qty 1

## 2020-03-18 MED ORDER — ONDANSETRON HCL 4 MG/2ML IJ SOLN: 4.0000 mg | Freq: Four times a day (QID) | INTRAMUSCULAR | Status: DC | PRN

## 2020-03-18 MED ORDER — FAMOTIDINE 20 MG PO TABS
20.0000 mg | ORAL_TABLET | Freq: Two times a day (BID) | ORAL | Status: DC
  Administered 2020-03-18 – 2020-03-20 (×6): 20 mg via ORAL
  Filled 2020-03-18 (×6): qty 1

## 2020-03-18 MED ORDER — ASCORBIC ACID 500 MG PO TABS
500.0000 mg | ORAL_TABLET | Freq: Every day | ORAL | Status: DC
  Administered 2020-03-18 – 2020-03-20 (×3): 500 mg via ORAL
  Filled 2020-03-18 (×3): qty 1

## 2020-03-18 MED ORDER — ACETAMINOPHEN 325 MG PO TABS: 650.0000 mg | ORAL_TABLET | Freq: Four times a day (QID) | ORAL | Status: DC | PRN

## 2020-03-18 MED ORDER — TRAZODONE HCL 50 MG PO TABS: 25.0000 mg | ORAL_TABLET | Freq: Every evening | ORAL | Status: DC | PRN

## 2020-03-18 MED ORDER — IVERMECTIN 3 MG PO TABS
16.0000 mg | ORAL_TABLET | Freq: Every day | ORAL | Status: DC
  Administered 2020-03-18: 06:00:00 16.5 mg via ORAL
  Filled 2020-03-18 (×2): qty 6

## 2020-03-18 MED ORDER — MAGNESIUM HYDROXIDE 400 MG/5ML PO SUSP
30.0000 mL | Freq: Every day | ORAL | Status: DC | PRN
  Filled 2020-03-18: qty 30

## 2020-03-18 MED ORDER — ENOXAPARIN SODIUM 40 MG/0.4ML ~~LOC~~ SOLN
40.0000 mg | Freq: Two times a day (BID) | SUBCUTANEOUS | Status: DC
  Administered 2020-03-18 – 2020-03-20 (×6): 40 mg via SUBCUTANEOUS
  Filled 2020-03-18 (×5): qty 0.4

## 2020-03-18 MED ORDER — SODIUM CHLORIDE 0.9 % IV SOLN
200.0000 mg | Freq: Once | INTRAVENOUS | Status: AC
  Administered 2020-03-18: 200 mg via INTRAVENOUS
  Filled 2020-03-18: qty 200

## 2020-03-18 MED ORDER — SODIUM CHLORIDE 0.9 % IV SOLN: INTRAVENOUS | Status: DC

## 2020-03-18 MED ORDER — SODIUM CHLORIDE 0.9 % IV SOLN
100.0000 mg | Freq: Every day | INTRAVENOUS | Status: DC
  Administered 2020-03-19 – 2020-03-20 (×2): 100 mg via INTRAVENOUS
  Filled 2020-03-18 (×2): qty 100

## 2020-03-18 MED ORDER — IOHEXOL 350 MG/ML SOLN
75.0000 mL | Freq: Once | INTRAVENOUS | Status: AC | PRN
  Administered 2020-03-18: 10:00:00 75 mL via INTRAVENOUS

## 2020-03-18 MED ORDER — ASPIRIN EC 81 MG PO TBEC
81.0000 mg | DELAYED_RELEASE_TABLET | Freq: Every day | ORAL | Status: DC
  Administered 2020-03-18 – 2020-03-20 (×3): 81 mg via ORAL
  Filled 2020-03-18 (×3): qty 1

## 2020-03-18 MED ORDER — ZINC SULFATE 220 (50 ZN) MG PO CAPS
220.0000 mg | ORAL_CAPSULE | Freq: Every day | ORAL | Status: DC
  Administered 2020-03-18 – 2020-03-20 (×3): 220 mg via ORAL
  Filled 2020-03-18 (×3): qty 1

## 2020-03-18 MED ORDER — DEXAMETHASONE SODIUM PHOSPHATE 10 MG/ML IJ SOLN
6.0000 mg | INTRAMUSCULAR | Status: DC
  Administered 2020-03-18: 6 mg via INTRAVENOUS
  Filled 2020-03-18: qty 1

## 2020-03-18 MED ORDER — ONDANSETRON HCL 4 MG PO TABS: 4.0000 mg | ORAL_TABLET | Freq: Four times a day (QID) | ORAL | Status: DC | PRN

## 2020-03-18 NOTE — Progress Notes (Addendum)
PROGRESS NOTE    Derek Khan  RJJ:[id number] DOB: 07-27-1991 DOA: 03/17/2020 PCP: Steele Sizer, MD   Brief Narrative:  29 y.o. pleasant obese African-American male with a known history of depression and herpes simplex, presented to the emergency room with acute onset of worsening dyspnea with associated cough as well as dyspnea on exertion.  The patient had a positive COVID-19 test on 4/19.  His symptoms started on 416 with dyspnea and cough that has been mainly dry as well as mild wheezing.  He experienced fever and chills that resolved a couple of days ago.  He also experienced loss of taste and smell which have resolved.  He admitted to nausea and vomiting as well as diarrhea which resolved.  No chest pain or palpitations.  No dysuria, oliguria or hematuria or flank pain.  4/27: Patient seen and examined.  Able to speak in complete sentences without increased work of breathing however remains hypoxic requiring 5 L supplemental oxygen.  CT angio thorax negative for PE.  Significant for multifocal pneumonia.   Assessment & Plan:   Active Problems:   Acute hypoxemic respiratory failure due to COVID-19 [facility])  Acute hypoxic respiratory failure secondary to COVID-19 Pneumonia due to COVID-19 virus Patient had a positive test on 03/10/2020 Shortness of breath symptoms have been present for 4 days Resolved loss of taste and smell Resolved subjective fevers and chills Remains hypoxic Elevated D-dimer, PE study negative Elevated CRP Plan: Continue remdesivir, day 2 of 5 Continue steroids, day 2 of 10 DC ivermectin Tocilizumab 800mg  infusion x1 Trend inflammatory markers Supplemental vitamins Supplemental oxygen Prone as tolerated Incentive spirometry and flutter valve use  Herpes simplex Continue home Valtrex  History of depression Currently on no medications  Morbid obesity, BMI 50.43 Counseled patient Nutrition consult  DVT prophylaxis: Lovenox Code Status:  Full Family Communication: Wife Jade via phone [phone] on 03/18/2020 Disposition Plan: Status is: Inpatient  Remains inpatient appropriate because:Inpatient level of care appropriate due to severity of illness   Dispo: The patient is from: Home              Anticipated d/c is to: Home              Anticipated d/c date is: 3 days              Patient currently is not medically stable to d/c.  Patient currently with acute hypoxic respiratory failure secondary to COVID-19 virus.  Consultants:   none  Procedures:   none  Antimicrobials:   Remdesivir    Subjective: Seen and examined Remains on 5L Speaking in complete sentences NAD  Objective: Vitals:   03/18/20 0545 03/18/20 0814 03/18/20 0822 03/18/20 0830  BP: 132/69 131/66 120/78 118/60  Pulse: 88 85 89 80  Resp: (!) 25 (!) 28 (!) 30 (!) 22  Temp:   98.9 F (37.2 C) 98.8 F (37.1 C)  TempSrc:      SpO2: 90% 91% (!) 89%   Weight:      Height:        Intake/Output Summary (Last 24 hours) at 03/18/2020 1321 Last data filed at 03/18/2020 0909 Gross per 24 hour  Intake 525.38 ml  Output 1100 ml  Net -574.62 ml   Filed Weights   03/17/20 1942  Weight: (!) 146.1 kg    Examination:  General exam: Appears calm and comfortable  Respiratory system: Diminished to bases.  Bibasilar crackles.  Normal work of breathing Cardiovascular system: S1 & S2 heard, RRR.  No JVD, murmurs, rubs, gallops or clicks. No pedal edema. Gastrointestinal system: Obese, nondistended, nontender, normal bowel sounds Central nervous system: Alert and oriented. No focal neurological deficits. Extremities: Symmetric 5 x 5 power. Skin: No rashes, lesions or ulcers Psychiatry: Judgement and insight appear normal. Mood & affect appropriate.     Data Reviewed: I have personally reviewed following labs and imaging studies  CBC: Recent Labs  Lab 03/17/20 2220  WBC 7.6  NEUTROABS 5.9  HGB 13.2  HCT 40.1  MCV 77.1*  PLT 174    Basic Metabolic Panel: Recent Labs  Lab 03/17/20 2220  NA 138  K 3.8  CL 102  CO2 25  GLUCOSE 99  BUN 9  CREATININE 0.81  CALCIUM 8.8*   GFR: Estimated Creatinine Clearance: 190.1 mL/min (by C-G formula based on SCr of 0.81 mg/dL). Liver Function Tests: Recent Labs  Lab 03/17/20 2220  AST 32  ALT 37  ALKPHOS 51  BILITOT 0.7  PROT 7.5  ALBUMIN 3.6   No results for input(s): LIPASE, AMYLASE in the last 168 hours. No results for input(s): AMMONIA in the last 168 hours. Coagulation Profile: No results for input(s): INR, PROTIME in the last 168 hours. Cardiac Enzymes: No results for input(s): CKTOTAL, CKMB, CKMBINDEX, TROPONINI in the last 168 hours. BNP (last 3 results) No results for input(s): PROBNP in the last 8760 hours. HbA1C: No results for input(s): HGBA1C in the last 72 hours. CBG: No results for input(s): GLUCAP in the last 168 hours. Lipid Profile: No results for input(s): CHOL, HDL, LDLCALC, TRIG, CHOLHDL, LDLDIRECT in the last 72 hours. Thyroid Function Tests: No results for input(s): TSH, T4TOTAL, FREET4, T3FREE, THYROIDAB in the last 72 hours. Anemia Panel: Recent Labs    03/18/20 0558  FERRITIN 471*   Sepsis Labs: Recent Labs  Lab 03/17/20 2220  PROCALCITON <0.10    Recent Results (from the past 240 hour(s))  Respiratory Panel by RT PCR (Flu A&B, Covid) - Nasopharyngeal Swab     Status: Abnormal   Collection Time: 03/17/20 11:06 PM   Specimen: Nasopharyngeal Swab  Result Value Ref Range Status   SARS Coronavirus 2 by RT PCR POSITIVE (A) NEGATIVE Final    Comment: RESULT CALLED TO, READ BACK BY AND VERIFIED WITH: BRANDY MILLER 0130 03/18/20 AKT (NOTE) SARS-CoV-2 target nucleic acids are DETECTED. SARS-CoV-2 RNA is generally detectable in upper respiratory specimens  during the acute phase of infection. Positive results are indicative of the presence of the identified virus, but do not rule out bacterial infection or co-infection with  other pathogens not detected by the test. Clinical correlation with patient history and other diagnostic information is necessary to determine patient infection status. The expected result is Negative. Fact Sheet for Patients:  https://www.moore.com/ Fact Sheet for Healthcare Providers: https://www.young.biz/ This test is not yet approved or cleared by the [place] FDA and  has been authorized for detection and/or diagnosis of SARS-CoV-2 by FDA under an Emergency Use Authorization (EUA).  This EUA will remain in effect (meaning this test can be used) for th e duration of  the COVID-19 declaration under Section 564(b)(1) of the Act, 21 U.S.C. section 360bbb-3(b)(1), unless the authorization is terminated or revoked sooner.    Influenza A by PCR NEGATIVE NEGATIVE Final   Influenza B by PCR NEGATIVE NEGATIVE Final    Comment: (NOTE) The Xpert Xpress SARS-CoV-2/FLU/RSV assay is intended as an aid in  the diagnosis of influenza from Nasopharyngeal swab specimens and  should not be used  as a sole basis for treatment. Nasal washings and  aspirates are unacceptable for Xpert Xpress SARS-CoV-2/FLU/RSV  testing. Fact Sheet for Patients: https://www.moore.com/ Fact Sheet for Healthcare Providers: https://www.young.biz/ This test is not yet approved or cleared by the [place] FDA and  has been authorized for detection and/or diagnosis of SARS-CoV-2 by  FDA under an Emergency Use Authorization (EUA). This EUA will remain  in effect (meaning this test can be used) for the duration of the  Covid-19 declaration under Section 564(b)(1) of the Act, 21  U.S.C. section 360bbb-3(b)(1), unless the authorization is  terminated or revoked. Performed at [facility], [address] [place], [place] [address]   Blood Culture (routine x 2)     Status: None (Preliminary result)   Collection Time: 03/17/20  11:37 PM   Specimen: BLOOD  Result Value Ref Range Status   Specimen Description BLOOD LEFT ANTECUBITAL  Final   Special Requests   Final    BOTTLES DRAWN AEROBIC AND ANAEROBIC Blood Culture adequate volume   Culture   Final    NO GROWTH < 12 HOURS Performed at [facility], [address], [place], [place] [address]    Report Status PENDING  Incomplete  Blood Culture (routine x 2)     Status: None (Preliminary result)   Collection Time: 03/17/20 11:37 PM   Specimen: BLOOD  Result Value Ref Range Status   Specimen Description BLOOD RIGHT ANTECUBITAL  Final   Special Requests   Final    BOTTLES DRAWN AEROBIC AND ANAEROBIC Blood Culture adequate volume   Culture   Final    NO GROWTH < 12 HOURS Performed at [facility], [address], [place], [place] [address]    Report Status PENDING  Incomplete         Radiology Studies: DG Chest 2 View  Result Date: 03/17/2020 CLINICAL DATA:  Pt presents to ED via EMS with worsening shortness of breathing over the past 4 days. Dx with COVID last Monday with dry cough, fever, and headache. EXAM: CHEST - 2 VIEW COMPARISON:  None. FINDINGS: The heart size and mediastinal contours are within normal limits. Low lung volumes. There are bilateral left greater than right infiltrates, predominantly involving the mid to lower lung. No pneumothorax. No pleural effusion. No acute finding in the visualized skeleton. IMPRESSION: Bilateral left greater than right infiltrates concerning for multifocal pneumonia. Electronically Signed   By: Emmaline Kluver M.D.   On: 03/17/2020 20:04   CT ANGIO CHEST PE W OR WO CONTRAST  Result Date: 03/18/2020 CLINICAL DATA:  29 year old male with worsening shortness of breath EXAM: CT ANGIOGRAPHY CHEST WITH CONTRAST TECHNIQUE: Multidetector CT imaging of the chest was performed using the standard protocol during bolus administration of intravenous contrast. Multiplanar CT image reconstructions and MIPs  were obtained to evaluate the vascular anatomy. CONTRAST:  79mL OMNIPAQUE IOHEXOL 350 MG/ML SOLN COMPARISON:  None. FINDINGS: Cardiovascular: Heart: No cardiomegaly. No pericardial fluid/thickening. No significant coronary calcifications. Aorta: Unremarkable course, caliber, contour of the thoracic aorta. No aneurysm or dissection flap. No periaortic fluid. Pulmonary arteries: Timing of the contrast bolus and respiratory motion somewhat decreases the sensitivity and specificity of the examination. There are no filling defects within the main pulmonary artery, right or left pulmonary arteries, or the segmental arteries. Beyond the segmental arteries, the fidelity of the exam is decreased. Mediastinum/Nodes: Multiple small lymph nodes of the mediastinum, none of which are enlarged. Unremarkable appearance of the thoracic esophagus. Unremarkable appearance of the  thoracic inlet Lungs/Pleura: Mixed confluent nodularity with ground-glass opacities of the bilateral upper lobes and bilateral lower lobes. To a lesser degree the right middle lobe is involved. Atelectatic changes in the dependent lung bases, superimposed on the background of mixed ground-glass and nodular opacity. No pleural effusion or pneumothorax. No interlobular septal thickening. Upper Abdomen: No acute. Musculoskeletal: No acute displaced fracture. Degenerative changes of the spine. Review of the MIP images confirms the above findings. IMPRESSION: CT negative for evidence of pulmonary emboli to the level of the segmental arteries. Beyond this, fidelity of the exam is decreased. Multifocal pneumonia and lower lung atelectasis. Electronically Signed   By: Gilmer Mor D.O.   On: 03/18/2020 10:05        Scheduled Meds: . vitamin C  500 mg Oral Daily  . aspirin EC  81 mg Oral Daily  . cholecalciferol  1,000 Units Oral Daily  . dexamethasone (DECADRON) injection  6 mg Intravenous Q24H  . enoxaparin (LOVENOX) injection  40 mg Subcutaneous BID  .  famotidine  20 mg Oral BID  . guaiFENesin  600 mg Oral BID  . ivermectin  16.5 mg Oral Daily  . valACYclovir  1,000 mg Oral BID  . zinc sulfate  220 mg Oral Daily   Continuous Infusions: . [START ON 03/19/2020] remdesivir 100 mg in NS 100 mL    . tocilizumab (ACTEMRA) - non-COVID treatment       LOS: 1 day    Time spent: 35 minutes    Tresa Moore, MD [facility] Pager [phone]  If 7PM-7AM, please contact night-coverage 03/18/2020, 1:21 PM

## 2020-03-18 NOTE — Progress Notes (Signed)
Remdesivir - Pharmacy Brief Note   O:  CXR: IMPRESSION: Bilateral left greater than right infiltrates concerning for multifocal pneumonia. SpO2: 92 - 93% on 4L [place]   A/P:  Remdesivir 200 mg IVPB once followed by 100 mg IVPB daily x 4 days.   Thomasene Ripple, PharmD, BCPS Clinical Pharmacist 03/18/2020 2:48 AM

## 2020-03-18 NOTE — Plan of Care (Signed)
Called and s/w wife to update on current POC and patient status.  No further questions at this point.

## 2020-03-18 NOTE — Plan of Care (Addendum)
Pt from home - caught COVID at work.  Tachypneic and tachycardic. MEWS score of 2  Admitted from ED as a 2.  Pt doesn't appear to be in distress and states so.  Administered something for cough and gave him something to eat b/c he said he was hungry for the first time in days.

## 2020-03-18 NOTE — Progress Notes (Signed)
Notified by lab of positive Covid-19  Results. Pt is currently on precautions appropriately. MD aware of external positive Covid results on admission and admitted pt with dx accordingly.

## 2020-03-19 ENCOUNTER — Telehealth: Payer: BC Managed Care – PPO | Admitting: Family Medicine

## 2020-03-19 DIAGNOSIS — J9601 Acute respiratory failure with hypoxia: Secondary | ICD-10-CM

## 2020-03-19 DIAGNOSIS — U071 COVID-19: Principal | ICD-10-CM

## 2020-03-19 LAB — COMPREHENSIVE METABOLIC PANEL
ALT: 43 U/L (ref 0–44)
AST: 29 U/L (ref 15–41)
Albumin: 3.7 g/dL (ref 3.5–5.0)
Alkaline Phosphatase: 52 U/L (ref 38–126)
Anion gap: 9 (ref 5–15)
BUN: 15 mg/dL (ref 6–20)
CO2: 26 mmol/L (ref 22–32)
Calcium: 9.1 mg/dL (ref 8.9–10.3)
Chloride: 105 mmol/L (ref 98–111)
Creatinine, Ser: 0.58 mg/dL — ABNORMAL LOW (ref 0.61–1.24)
GFR calc Af Amer: >60 mL/min (ref >60)
GFR calc non Af Amer: >60 mL/min (ref >60)
Glucose, Bld: 136 mg/dL — ABNORMAL HIGH (ref 70–99)
Potassium: 4.3 mmol/L (ref 3.5–5.1)
Sodium: 140 mmol/L (ref 135–145)
Total Bilirubin: 0.6 mg/dL (ref 0.3–1.2)
Total Protein: 7.8 g/dL (ref 6.5–8.1)

## 2020-03-19 LAB — CBC WITH DIFFERENTIAL/PLATELET
Abs Immature Granulocytes: 0.03 10*3/uL (ref 0.00–0.07)
Basophils Absolute: 0 10*3/uL (ref 0.0–0.1)
Basophils Relative: 0 %
Eosinophils Absolute: 0 10*3/uL (ref 0.0–0.5)
Eosinophils Relative: 0 %
HCT: 40.1 % (ref 39.0–52.0)
Hemoglobin: 13 g/dL (ref 13.0–17.0)
Immature Granulocytes: 1 %
Lymphocytes Relative: 13 %
Lymphs Abs: 0.7 10*3/uL (ref 0.7–4.0)
MCH: 25.2 pg — ABNORMAL LOW (ref 26.0–34.0)
MCHC: 32.4 g/dL (ref 30.0–36.0)
MCV: 77.9 fL — ABNORMAL LOW (ref 80.0–100.0)
Monocytes Absolute: 0.3 10*3/uL (ref 0.1–1.0)
Monocytes Relative: 5 %
Neutro Abs: 4.4 10*3/uL (ref 1.7–7.7)
Neutrophils Relative %: 81 %
Platelets: 248 10*3/uL (ref 150–400)
RBC: 5.15 MIL/uL (ref 4.22–5.81)
RDW: 13.4 % (ref 11.5–15.5)
Smear Review: NORMAL
WBC: 5.4 10*3/uL (ref 4.0–10.5)
nRBC: 0 % (ref 0.0–0.2)

## 2020-03-19 LAB — FIBRIN DERIVATIVES D-DIMER (ARMC ONLY): Fibrin derivatives D-dimer (ARMC): 1315.48 ng/mL (FEU) — ABNORMAL HIGH (ref 0.00–499.00)

## 2020-03-19 LAB — FERRITIN: Ferritin: 428 ng/mL — ABNORMAL HIGH (ref 24–336)

## 2020-03-19 LAB — C-REACTIVE PROTEIN: CRP: 9.1 mg/dL — ABNORMAL HIGH (ref <1.0)

## 2020-03-19 MED ORDER — METHYLPREDNISOLONE SODIUM SUCC 40 MG IJ SOLR
40.0000 mg | Freq: Three times a day (TID) | INTRAMUSCULAR | Status: DC
  Administered 2020-03-19 – 2020-03-20 (×3): 40 mg via INTRAVENOUS
  Filled 2020-03-19 (×3): qty 1

## 2020-03-19 NOTE — Progress Notes (Signed)
PROGRESS NOTE    [place] Fill  UJW:[id number] DOB: 09/11/1991 DOA: 03/17/2020 PCP: Alba Cory, MD   Brief Narrative:  29 y.o. pleasant obese African-American male with a known history of depression and herpes simplex, presented to the emergency room with acute onset of worsening dyspnea with associated cough as well as dyspnea on exertion.  The patient had a positive COVID-19 test on 4/19.  His symptoms started on 416 with dyspnea and cough that has been mainly dry as well as mild wheezing.  He experienced fever and chills that resolved a couple of days ago.  He also experienced loss of taste and smell which have resolved.  He admitted to nausea and vomiting as well as diarrhea which resolved.  No chest pain or palpitations.  No dysuria, oliguria or hematuria or flank pain.  4/27: Patient seen and examined.  Able to speak in complete sentences without increased work of breathing however remains hypoxic requiring 5 L supplemental oxygen.  CT angio thorax negative for PE.  Significant for multifocal pneumonia.   Assessment & Plan:   Active Problems:   Acute hypoxemic respiratory failure due to COVID-19 [facility])  Acute hypoxic respiratory failure secondary to COVID-19 Pneumonia due to COVID-19 virus Patient had a positive test on 03/10/2020 Shortness of breath symptoms have been present for 4 days Resolved loss of taste and smell Resolved subjective fevers and chills Remains hypoxic Elevated D-dimer, PE study negative Elevated CRP Tocilizumab 800mg  infusion x1 Plan: Continue remdesivir Increase steroids to solumedrol 40 mg q8 2/2 elevated CRP Trend inflammatory markers Supplemental vitamins Supplemental oxygen Prone as tolerated Incentive spirometry and flutter valve use  Herpes simplex Continue home Valtrex  History of depression Currently on no medications  Morbid obesity, BMI 50.43 Counseled patient Nutrition consult  DVT prophylaxis: Lovenox Code Status: Full  Family Communication: Wife Jade via phone [phone] on 03/18/2020 Disposition Plan: Status is: Inpatient  Remains inpatient appropriate because:Inpatient level of care appropriate due to severity of illness   Dispo: The patient is from: Home              Anticipated d/c is to: Home              Anticipated d/c date is: 2 days              Patient currently is not medically stable to d/c.  Patient currently with acute hypoxic respiratory failure secondary to COVID-19 virus, still needing 3L.     Consultants:   none  Procedures:   none  Antimicrobials:   Remdesivir    Subjective: Pt reported feeling a lot better.  Coughing a little with sputum production.  Ate ok.  No fever, chest pain, abdominal pain, N/V/D.   Objective: Vitals:   03/19/20 1010 03/19/20 1020 03/19/20 1101 03/19/20 1621  BP: 125/80 118/85  (!) 143/87  Pulse: 83 98 79   Resp: (!) 28 (!) 23 20   Temp: 98.5 F (36.9 C) 98.4 F (36.9 C)    TempSrc: Oral     SpO2: 95% 91% 98% 95%  Weight:      Height:        Intake/Output Summary (Last 24 hours) at 03/19/2020 1800 Last data filed at 03/19/2020 1700 Gross per 24 hour  Intake 100 ml  Output 550 ml  Net -450 ml   Filed Weights   03/17/20 1942  Weight: (!) 146.1 kg    Examination:  Constitutional: NAD, AAOx3 HEENT: conjunctivae and lids normal, EOMI CV: RRR  no M,R,G. Distal pulses +2.  No cyanosis.   RESP: reduced lung sounds, normal respiratory effort, on 3L GI: +BS, NTND Extremities: No effusions, edema, or tenderness in BLE MSK: normal ROM and strength, no joint enlargement or tenderness of both UE and LE SKIN: warm, dry and intact Neuro: II - XII grossly intact.  Sensation intact Psych: Normal mood and affect.  Appropriate judgement and reason    Data Reviewed: I have personally reviewed following labs and imaging studies  CBC: Recent Labs  Lab 03/17/20 2220 03/19/20 0411  WBC 7.6 5.4  NEUTROABS 5.9 4.4  HGB 13.2 13.0  HCT  40.1 40.1  MCV 77.1* 77.9*  PLT 174 834   Basic Metabolic Panel: Recent Labs  Lab 03/17/20 2220 03/19/20 0411  NA 138 140  K 3.8 4.3  CL 102 105  CO2 25 26  GLUCOSE 99 136*  BUN 9 15  CREATININE 0.81 0.58*  CALCIUM 8.8* 9.1   GFR: Estimated Creatinine Clearance: 192.5 mL/min (A) (by C-G formula based on SCr of 0.58 mg/dL (L)). Liver Function Tests: Recent Labs  Lab 03/17/20 2220 03/19/20 0411  AST 32 29  ALT 37 43  ALKPHOS 51 52  BILITOT 0.7 0.6  PROT 7.5 7.8  ALBUMIN 3.6 3.7   No results for input(s): LIPASE, AMYLASE in the last 168 hours. No results for input(s): AMMONIA in the last 168 hours. Coagulation Profile: No results for input(s): INR, PROTIME in the last 168 hours. Cardiac Enzymes: No results for input(s): CKTOTAL, CKMB, CKMBINDEX, TROPONINI in the last 168 hours. BNP (last 3 results) No results for input(s): PROBNP in the last 8760 hours. HbA1C: No results for input(s): HGBA1C in the last 72 hours. CBG: No results for input(s): GLUCAP in the last 168 hours. Lipid Profile: No results for input(s): CHOL, HDL, LDLCALC, TRIG, CHOLHDL, LDLDIRECT in the last 72 hours. Thyroid Function Tests: No results for input(s): TSH, T4TOTAL, FREET4, T3FREE, THYROIDAB in the last 72 hours. Anemia Panel: Recent Labs    03/18/20 0558 03/19/20 0411  FERRITIN 471* 428*   Sepsis Labs: Recent Labs  Lab 03/17/20 2220  PROCALCITON <0.10    Recent Results (from the past 240 hour(s))  Respiratory Panel by RT PCR (Flu A&B, Covid) - Nasopharyngeal Swab     Status: Abnormal   Collection Time: 03/17/20 11:06 PM   Specimen: Nasopharyngeal Swab  Result Value Ref Range Status   SARS Coronavirus 2 by RT PCR POSITIVE (A) NEGATIVE Final    Comment: RESULT CALLED TO, READ BACK BY AND VERIFIED WITH: BRANDY MILLER 0130 03/18/20 AKT (NOTE) SARS-CoV-2 target nucleic acids are DETECTED. SARS-CoV-2 RNA is generally detectable in upper respiratory specimens  during the acute  phase of infection. Positive results are indicative of the presence of the identified virus, but do not rule out bacterial infection or co-infection with other pathogens not detected by the test. Clinical correlation with patient history and other diagnostic information is necessary to determine patient infection status. The expected result is Negative. Fact Sheet for Patients:  PinkCheek.be Fact Sheet for Healthcare Providers: GravelBags.it This test is not yet approved or cleared by the [place] FDA and  has been authorized for detection and/or diagnosis of SARS-CoV-2 by FDA under an Emergency Use Authorization (EUA).  This EUA will remain in effect (meaning this test can be used) for th e duration of  the COVID-19 declaration under Section 564(b)(1) of the Act, 21 U.S.C. section 360bbb-3(b)(1), unless the authorization is terminated or revoked sooner.  Influenza A by PCR NEGATIVE NEGATIVE Final   Influenza B by PCR NEGATIVE NEGATIVE Final    Comment: (NOTE) The Xpert Xpress SARS-CoV-2/FLU/RSV assay is intended as an aid in  the diagnosis of influenza from Nasopharyngeal swab specimens and  should not be used as a sole basis for treatment. Nasal washings and  aspirates are unacceptable for Xpert Xpress SARS-CoV-2/FLU/RSV  testing. Fact Sheet for Patients: https://www.moore.com/ Fact Sheet for Healthcare Providers: https://www.young.biz/ This test is not yet approved or cleared by the [place] FDA and  has been authorized for detection and/or diagnosis of SARS-CoV-2 by  FDA under an Emergency Use Authorization (EUA). This EUA will remain  in effect (meaning this test can be used) for the duration of the  Covid-19 declaration under Section 564(b)(1) of the Act, 21  U.S.C. section 360bbb-3(b)(1), unless the authorization is  terminated or revoked. Performed at [facility], [address] [place], [place] [address]   Blood Culture (routine x 2)     Status: None (Preliminary result)   Collection Time: 03/17/20 11:37 PM   Specimen: BLOOD  Result Value Ref Range Status   Specimen Description BLOOD LEFT ANTECUBITAL  Final   Special Requests   Final    BOTTLES DRAWN AEROBIC AND ANAEROBIC Blood Culture adequate volume   Culture   Final    NO GROWTH 2 DAYS Performed at [facility], [address], [place], [place] [address]    Report Status PENDING  Incomplete  Blood Culture (routine x 2)     Status: None (Preliminary result)   Collection Time: 03/17/20 11:37 PM   Specimen: BLOOD  Result Value Ref Range Status   Specimen Description BLOOD RIGHT ANTECUBITAL  Final   Special Requests   Final    BOTTLES DRAWN AEROBIC AND ANAEROBIC Blood Culture adequate volume   Culture   Final    NO GROWTH 2 DAYS Performed at [facility], [address], [place], [place] [address]    Report Status PENDING  Incomplete         Radiology Studies: DG Chest 2 View  Result Date: 03/17/2020 CLINICAL DATA:  Pt presents to ED via EMS with worsening shortness of breathing over the past 4 days. Dx with COVID last Monday with dry cough, fever, and headache. EXAM: CHEST - 2 VIEW COMPARISON:  None. FINDINGS: The heart size and mediastinal contours are within normal limits. Low lung volumes. There are bilateral left greater than right infiltrates, predominantly involving the mid to lower lung. No pneumothorax. No pleural effusion. No acute finding in the visualized skeleton. IMPRESSION: Bilateral left greater than right infiltrates concerning for multifocal pneumonia. Electronically Signed   By: Emmaline Kluver M.D.   On: 03/17/2020 20:04   CT ANGIO CHEST PE W OR WO CONTRAST  Result Date: 03/18/2020 CLINICAL DATA:  29 year old male with worsening shortness of breath EXAM: CT ANGIOGRAPHY CHEST WITH CONTRAST TECHNIQUE: Multidetector CT imaging of  the chest was performed using the standard protocol during bolus administration of intravenous contrast. Multiplanar CT image reconstructions and MIPs were obtained to evaluate the vascular anatomy. CONTRAST:  25mL OMNIPAQUE IOHEXOL 350 MG/ML SOLN COMPARISON:  None. FINDINGS: Cardiovascular: Heart: No cardiomegaly. No pericardial fluid/thickening. No significant coronary calcifications. Aorta: Unremarkable course, caliber, contour of the thoracic aorta. No aneurysm or dissection flap. No periaortic fluid. Pulmonary arteries: Timing of the contrast bolus and respiratory motion somewhat decreases the sensitivity and specificity of the examination. There are no filling defects within the main  pulmonary artery, right or left pulmonary arteries, or the segmental arteries. Beyond the segmental arteries, the fidelity of the exam is decreased. Mediastinum/Nodes: Multiple small lymph nodes of the mediastinum, none of which are enlarged. Unremarkable appearance of the thoracic esophagus. Unremarkable appearance of the thoracic inlet Lungs/Pleura: Mixed confluent nodularity with ground-glass opacities of the bilateral upper lobes and bilateral lower lobes. To a lesser degree the right middle lobe is involved. Atelectatic changes in the dependent lung bases, superimposed on the background of mixed ground-glass and nodular opacity. No pleural effusion or pneumothorax. No interlobular septal thickening. Upper Abdomen: No acute. Musculoskeletal: No acute displaced fracture. Degenerative changes of the spine. Review of the MIP images confirms the above findings. IMPRESSION: CT negative for evidence of pulmonary emboli to the level of the segmental arteries. Beyond this, fidelity of the exam is decreased. Multifocal pneumonia and lower lung atelectasis. Electronically Signed   By: Gilmer Mor D.O.   On: 03/18/2020 10:05        Scheduled Meds: . vitamin C  500 mg Oral Daily  . aspirin EC  81 mg Oral Daily  .  cholecalciferol  1,000 Units Oral Daily  . enoxaparin (LOVENOX) injection  40 mg Subcutaneous BID  . famotidine  20 mg Oral BID  . guaiFENesin  600 mg Oral BID  . methylPREDNISolone (SOLU-MEDROL) injection  40 mg Intravenous Q8H  . valACYclovir  1,000 mg Oral BID  . zinc sulfate  220 mg Oral Daily   Continuous Infusions: . remdesivir 100 mg in NS 100 mL 100 mg (03/19/20 0810)     LOS: 2 days    Darlin Priestly, MD [facility] Pager [phone]  If 7PM-7AM, please contact night-coverage 03/19/2020, 6:00 PM

## 2020-03-20 LAB — BASIC METABOLIC PANEL
Anion gap: 9 (ref 5–15)
BUN: 15 mg/dL (ref 6–20)
CO2: 24 mmol/L (ref 22–32)
Calcium: 9.3 mg/dL (ref 8.9–10.3)
Chloride: 107 mmol/L (ref 98–111)
Creatinine, Ser: 0.65 mg/dL (ref 0.61–1.24)
GFR calc Af Amer: >60 mL/min (ref >60)
GFR calc non Af Amer: >60 mL/min (ref >60)
Glucose, Bld: 140 mg/dL — ABNORMAL HIGH (ref 70–99)
Potassium: 4.4 mmol/L (ref 3.5–5.1)
Sodium: 140 mmol/L (ref 135–145)

## 2020-03-20 LAB — CBC
HCT: 41.2 % (ref 39.0–52.0)
Hemoglobin: 13.7 g/dL (ref 13.0–17.0)
MCH: 25.3 pg — ABNORMAL LOW (ref 26.0–34.0)
MCHC: 33.3 g/dL (ref 30.0–36.0)
MCV: 76 fL — ABNORMAL LOW (ref 80.0–100.0)
Platelets: 309 10*3/uL (ref 150–400)
RBC: 5.42 MIL/uL (ref 4.22–5.81)
RDW: 13.4 % (ref 11.5–15.5)
WBC: 7.6 10*3/uL (ref 4.0–10.5)
nRBC: 0 % (ref 0.0–0.2)

## 2020-03-20 LAB — C-REACTIVE PROTEIN: CRP: 4 mg/dL — ABNORMAL HIGH (ref <1.0)

## 2020-03-20 LAB — MAGNESIUM: Magnesium: 2.3 mg/dL (ref 1.7–2.4)

## 2020-03-20 MED ORDER — DEXAMETHASONE 6 MG PO TABS: 6.0000 mg | ORAL_TABLET | Freq: Every day | ORAL | 0 refills | Status: AC

## 2020-03-20 MED ORDER — VALACYCLOVIR HCL 1 G PO TABS: 1000.0000 mg | ORAL_TABLET | Freq: Two times a day (BID) | ORAL | Status: DC | PRN

## 2020-03-20 MED ORDER — DEXAMETHASONE 4 MG PO TABS: 6.0000 mg | ORAL_TABLET | Freq: Every day | ORAL | Status: DC

## 2020-03-20 NOTE — Progress Notes (Signed)
Patient scheduled for outpatient Remdesivir infusion at 10:00 AM on Friday 4/30 and Saturday 5/1.  Please advise them to report to [facility] at [address].  Drive to the security guard and tell them you are here for an infusion. They will direct you to the front entrance where we will come and get you.  For questions call [phone].  Thanks

## 2020-03-20 NOTE — Discharge Instructions (Addendum)
You are scheduled for an outpatient infusion of Remdesivir at 10:00 AM on Friday 4/30 and Saturday 5/1.  Please report to [facility] at [address].  Drive to the security guard and tell them you are here for an infusion. They will direct you to the front entrance where we will come and get you.  For questions call [phone].  Thanks      

## 2020-03-20 NOTE — Discharge Summary (Signed)
Physician Discharge Summary   Derek Khan  male DOB: 05-11-1991  OAC:[id number]  PCP: Alba Cory, MD  Admit date: 03/17/2020 Discharge date: 03/20/2020  Admitted From: home Disposition:  home Mother updated at bedside prior to discharge. CODE STATUS: Full code  Discharge Instructions    Diet - low sodium heart healthy   Complete by: As directed    Discharge instructions   Complete by: As directed    You have received 3 days of COVID treatment with IV Remdesivir, and has done well and now no longer needing supplemental oxygen.  We have arranged for you to received the remaining 2 doses at the outpatient infusion clinic.  Please finish taking 7 more days of steroid (Decadon) to help calm inflammation.  Outpatient Remdesivir infusion at 10:00 AM on Friday 4/30 and Saturday 5/1.  At [facility] at [address].  Transportation has been arranged to take you to and from the clinic.  For questions call [phone].   Dr. Darlin Priestly - -   Increase activity slowly   Complete by: As directed        Hospital Course:  For full details, please see H&P, progress notes, consult notes and ancillary notes.  Briefly,  Derek Monmouth Elford is a 29 y.o. obeseAfrican-Americanmalewith a known history ofdepression and herpes simplex, presented to the emergency room with acute onset of worsening dyspnea with associated cough as well as dyspnea on exertion. The patient had a positive COVID-19 test on 4/19. His symptoms started on 4/16with dyspnea and cough that has been mainly dry as well as mild wheezing. He experienced fever and chills that resolved a couple of days prior. He also experienced loss of taste and smell which have resolved. He admitted to nausea and vomiting as well as diarrhea which resolved.   Acute hypoxic respiratory failure secondary to COVID-19 Pneumonia  Patient had a positive test on 03/10/2020.  Shortness of breath symptoms have been  present for 4 days prior.  PE study negative.  Pt was hypoxic and had O2 requirement that peaked at 5L, however, improved quickly and was down to RA even with ambulation on the day of discharge.  Pt was started on Remdesivir and received Tocilizumab 800mg  infusion x1.  Due to elevated CRP to 16.6 on presentation, pt was ordered Solumedrol 40 mg q8h with resultant quick decrease of CRP to 4 on the day of discharge.  Pt received 3 doses of IV Remdesivir, and was arranged to receive the remaining 2 doses at the outpatient infusion clinic.  Pt was prescribed 7 more days of Decadron at discharge.  Herpes simplex Continued home Valtrex  History of depression Currently on no medications  Morbid obesity, BMI 50.43 Counseled patient on weight loss.   Discharge Diagnoses:  Active Problems:   Acute hypoxemic respiratory failure due to COVID-19 [facility])    Discharge Instructions:  Allergies as of 03/20/2020   No Known Allergies     Medication List    STOP taking these medications   azithromycin 250 MG tablet Commonly known as: Zithromax Z-Pak     TAKE these medications   acetaminophen 500 MG tablet Commonly known as: TYLENOL Take 500 mg by mouth every 6 (six) hours as needed.   albuterol (2.5 MG/3ML) 0.083% nebulizer solution Commonly known as: PROVENTIL Take 2.5 mg by nebulization every 6 (six) hours as needed.   benzonatate 100 MG capsule Commonly known as: Tessalon Perles Take 1 capsule (100 mg total) by mouth 3 (  three) times daily as needed for cough.   dexamethasone 6 MG tablet Commonly known as: DECADRON Take 1 tablet (6 mg total) by mouth daily for 7 days. To calm inflammation induced by COVID. Start taking on: March 21, 2020   guaiFENesin 600 MG 12 hr tablet Commonly known as: MUCINEX Take 600 mg by mouth 2 (two) times daily as needed.   valACYclovir 1000 MG tablet Commonly known as: VALTREX Take 1 tablet (1,000 mg total) by mouth 2 (two) times daily as needed. Prn  outbreak       Follow-up Information    Steele Sizer, MD. Schedule an appointment as soon as possible for a visit in 1 week(s).   Specialty: Family Medicine Contact information: [address] [place] [address] [phone]           No Known Allergies   The results of significant diagnostics from this hospitalization (including imaging, microbiology, ancillary and laboratory) are listed below for reference.   Consultations:   Procedures/Studies: DG Chest 2 View  Result Date: 03/17/2020 CLINICAL DATA:  Pt presents to ED via EMS with worsening shortness of breathing over the past 4 days. Dx with COVID last Monday with dry cough, fever, and headache. EXAM: CHEST - 2 VIEW COMPARISON:  None. FINDINGS: The heart size and mediastinal contours are within normal limits. Low lung volumes. There are bilateral left greater than right infiltrates, predominantly involving the mid to lower lung. No pneumothorax. No pleural effusion. No acute finding in the visualized skeleton. IMPRESSION: Bilateral left greater than right infiltrates concerning for multifocal pneumonia. Electronically Signed   By: Audie Pinto M.D.   On: 03/17/2020 20:04   CT ANGIO CHEST PE W OR WO CONTRAST  Result Date: 03/18/2020 CLINICAL DATA:  29 year old male with worsening shortness of breath EXAM: CT ANGIOGRAPHY CHEST WITH CONTRAST TECHNIQUE: Multidetector CT imaging of the chest was performed using the standard protocol during bolus administration of intravenous contrast. Multiplanar CT image reconstructions and MIPs were obtained to evaluate the vascular anatomy. CONTRAST:  62mL OMNIPAQUE IOHEXOL 350 MG/ML SOLN COMPARISON:  None. FINDINGS: Cardiovascular: Heart: No cardiomegaly. No pericardial fluid/thickening. No significant coronary calcifications. Aorta: Unremarkable course, caliber, contour of the thoracic aorta. No aneurysm or dissection flap. No periaortic fluid. Pulmonary arteries: Timing  of the contrast bolus and respiratory motion somewhat decreases the sensitivity and specificity of the examination. There are no filling defects within the main pulmonary artery, right or left pulmonary arteries, or the segmental arteries. Beyond the segmental arteries, the fidelity of the exam is decreased. Mediastinum/Nodes: Multiple small lymph nodes of the mediastinum, none of which are enlarged. Unremarkable appearance of the thoracic esophagus. Unremarkable appearance of the thoracic inlet Lungs/Pleura: Mixed confluent nodularity with ground-glass opacities of the bilateral upper lobes and bilateral lower lobes. To a lesser degree the right middle lobe is involved. Atelectatic changes in the dependent lung bases, superimposed on the background of mixed ground-glass and nodular opacity. No pleural effusion or pneumothorax. No interlobular septal thickening. Upper Abdomen: No acute. Musculoskeletal: No acute displaced fracture. Degenerative changes of the spine. Review of the MIP images confirms the above findings. IMPRESSION: CT negative for evidence of pulmonary emboli to the level of the segmental arteries. Beyond this, fidelity of the exam is decreased. Multifocal pneumonia and lower lung atelectasis. Electronically Signed   By: Corrie Mckusick D.O.   On: 03/18/2020 10:05      Labs: BNP (last 3 results) No results for input(s): BNP in the last 8760  hours. Basic Metabolic Panel: Recent Labs  Lab 03/17/20 2220 03/19/20 0411 03/20/20 0440  NA 138 140 140  K 3.8 4.3 4.4  CL 102 105 107  CO2 25 26 24   GLUCOSE 99 136* 140*  BUN 9 15 15   CREATININE 0.81 0.58* 0.65  CALCIUM 8.8* 9.1 9.3  MG  --   --  2.3   Liver Function Tests: Recent Labs  Lab 03/17/20 2220 03/19/20 0411  AST 32 29  ALT 37 43  ALKPHOS 51 52  BILITOT 0.7 0.6  PROT 7.5 7.8  ALBUMIN 3.6 3.7   No results for input(s): LIPASE, AMYLASE in the last 168 hours. No results for input(s): AMMONIA in the last 168  hours. CBC: Recent Labs  Lab 03/17/20 2220 03/19/20 0411 03/20/20 0440  WBC 7.6 5.4 7.6  NEUTROABS 5.9 4.4  --   HGB 13.2 13.0 13.7  HCT 40.1 40.1 41.2  MCV 77.1* 77.9* 76.0*  PLT 174 248 309   Cardiac Enzymes: No results for input(s): CKTOTAL, CKMB, CKMBINDEX, TROPONINI in the last 168 hours. BNP: Invalid input(s): POCBNP CBG: No results for input(s): GLUCAP in the last 168 hours. D-Dimer No results for input(s): DDIMER in the last 72 hours. Hgb A1c No results for input(s): HGBA1C in the last 72 hours. Lipid Profile No results for input(s): CHOL, HDL, LDLCALC, TRIG, CHOLHDL, LDLDIRECT in the last 72 hours. Thyroid function studies No results for input(s): TSH, T4TOTAL, T3FREE, THYROIDAB in the last 72 hours.  Invalid input(s): FREET3 Anemia work up Recent Labs    03/18/20 0558 03/19/20 0411  FERRITIN 471* 428*   Urinalysis    Component Value Date/Time   APPEARANCEUR clear 02/13/2019 1130   BILIRUBINUR Negative 02/13/2019 1130   PROTEINUR Negative 02/13/2019 1130   UROBILINOGEN 0.2 02/13/2019 1130   NITRITE Negative 02/13/2019 1130   LEUKOCYTESUR Negative 02/13/2019 1130   Sepsis Labs Invalid input(s): PROCALCITONIN,  WBC,  LACTICIDVEN Microbiology Recent Results (from the past 240 hour(s))  Respiratory Panel by RT PCR (Flu A&B, Covid) - Nasopharyngeal Swab     Status: Abnormal   Collection Time: 03/17/20 11:06 PM   Specimen: Nasopharyngeal Swab  Result Value Ref Range Status   SARS Coronavirus 2 by RT PCR POSITIVE (A) NEGATIVE Final    Comment: RESULT CALLED TO, READ BACK BY AND VERIFIED WITH: BRANDY MILLER 0130 03/18/20 AKT (NOTE) SARS-CoV-2 target nucleic acids are DETECTED. SARS-CoV-2 RNA is generally detectable in upper respiratory specimens  during the acute phase of infection. Positive results are indicative of the presence of the identified virus, but do not rule out bacterial infection or co-infection with other pathogens not detected by the  test. Clinical correlation with patient history and other diagnostic information is necessary to determine patient infection status. The expected result is Negative. Fact Sheet for Patients:  03/19/20 Fact Sheet for Healthcare Providers: 03/20/20 This test is not yet approved or cleared by the https://www.moore.com/ FDA and  has been authorized for detection and/or diagnosis of SARS-CoV-2 by FDA under an Emergency Use Authorization (EUA).  This EUA will remain in effect (meaning this test can be used) for th e duration of  the COVID-19 declaration under Section 564(b)(1) of the Act, 21 U.S.C. section 360bbb-3(b)(1), unless the authorization is terminated or revoked sooner.    Influenza A by PCR NEGATIVE NEGATIVE Final   Influenza B by PCR NEGATIVE NEGATIVE Final    Comment: (NOTE) The Xpert Xpress SARS-CoV-2/FLU/RSV assay is intended as an aid in  the diagnosis  of influenza from Nasopharyngeal swab specimens and  should not be used as a sole basis for treatment. Nasal washings and  aspirates are unacceptable for Xpert Xpress SARS-CoV-2/FLU/RSV  testing. Fact Sheet for Patients: https://www.moore.com/ Fact Sheet for Healthcare Providers: https://www.young.biz/ This test is not yet approved or cleared by the [place] FDA and  has been authorized for detection and/or diagnosis of SARS-CoV-2 by  FDA under an Emergency Use Authorization (EUA). This EUA will remain  in effect (meaning this test can be used) for the duration of the  Covid-19 declaration under Section 564(b)(1) of the Act, 21  U.S.C. section 360bbb-3(b)(1), unless the authorization is  terminated or revoked. Performed at [facility], [address], [place], [place] [address]   Blood Culture (routine x 2)     Status: None (Preliminary result)   Collection Time: 03/17/20 11:37 PM   Specimen: BLOOD  Result  Value Ref Range Status   Specimen Description BLOOD LEFT ANTECUBITAL  Final   Special Requests   Final    BOTTLES DRAWN AEROBIC AND ANAEROBIC Blood Culture adequate volume   Culture   Final    NO GROWTH 3 DAYS Performed at [facility], [address], [place], [place] [address]    Report Status PENDING  Incomplete  Blood Culture (routine x 2)     Status: None (Preliminary result)   Collection Time: 03/17/20 11:37 PM   Specimen: BLOOD  Result Value Ref Range Status   Specimen Description BLOOD RIGHT ANTECUBITAL  Final   Special Requests   Final    BOTTLES DRAWN AEROBIC AND ANAEROBIC Blood Culture adequate volume   Culture   Final    NO GROWTH 3 DAYS Performed at [facility], [address], [place], [place] [address]    Report Status PENDING  Incomplete     Total time spend on discharging this patient, including the last patient exam, discussing the hospital stay, instructions for ongoing care as it relates to all pertinent caregivers, as well as preparing the medical discharge records, prescriptions, and/or referrals as applicable, is 35 minutes.    Darlin Priestly, MD  [facility] 03/20/2020, 10:41 AM  If 7PM-7AM, please contact night-coverage

## 2020-03-20 NOTE — Discharge Planning (Signed)
IV x2 and tele removed.  Discharged papers printed, given, explained and educated.  Informed of needed PCP FU appt and appt made.  Outpatient Remdesivir infusions set up with transportation.  Weaned to RA prior to DC so no home O2 needed.  Scripts sent to [facility] per patient request.  Work note also given for continued continuation of quarantine.  Once ready - wheeled to front and wife transporting home via car.

## 2020-03-21 ENCOUNTER — Ambulatory Visit (HOSPITAL_COMMUNITY)
Admission: RE | Admit: 2020-03-21 | Discharge: 2020-03-21 | Disposition: A | Discharge status: Home / Self Care | Payer: BC Managed Care – PPO | Source: Ambulatory Visit | Attending: Pulmonary Disease | Admitting: Pulmonary Disease

## 2020-03-21 DIAGNOSIS — J1282 Pneumonia due to coronavirus disease 2019: Secondary | ICD-10-CM | POA: Insufficient documentation

## 2020-03-21 DIAGNOSIS — U071 COVID-19: Secondary | ICD-10-CM | POA: Diagnosis present

## 2020-03-21 MED ORDER — EPINEPHRINE 0.3 MG/0.3ML IJ SOAJ: 0.3000 mg | Freq: Once | INTRAMUSCULAR | Status: DC | PRN

## 2020-03-21 MED ORDER — METHYLPREDNISOLONE SODIUM SUCC 125 MG IJ SOLR: 125.0000 mg | Freq: Once | INTRAMUSCULAR | Status: DC | PRN

## 2020-03-21 MED ORDER — SODIUM CHLORIDE 0.9 % IV SOLN: INTRAVENOUS | Status: DC | PRN

## 2020-03-21 MED ORDER — FAMOTIDINE IN NACL 20-0.9 MG/50ML-% IV SOLN: 20.0000 mg | Freq: Once | INTRAVENOUS | Status: DC | PRN

## 2020-03-21 MED ORDER — DIPHENHYDRAMINE HCL 50 MG/ML IJ SOLN: 50.0000 mg | Freq: Once | INTRAMUSCULAR | Status: DC | PRN

## 2020-03-21 MED ORDER — ALBUTEROL SULFATE HFA 108 (90 BASE) MCG/ACT IN AERS: 2.0000 | INHALATION_SPRAY | Freq: Once | RESPIRATORY_TRACT | Status: DC | PRN

## 2020-03-21 MED ORDER — SODIUM CHLORIDE 0.9 % IV SOLN
100.0000 mg | Freq: Once | INTRAVENOUS | Status: AC
  Administered 2020-03-21: 100 mg via INTRAVENOUS
  Filled 2020-03-21: qty 20

## 2020-03-21 NOTE — Discharge Instructions (Signed)
10 Things You Can Do to Manage Your COVID-19 Symptoms at Home If you have possible or confirmed COVID-19: 1. Stay home from work and school. And stay away from other public places. If you must go out, avoid using any kind of public transportation, ridesharing, or taxis. 2. Monitor your symptoms carefully. If your symptoms get worse, call your healthcare provider immediately. 3. Get rest and stay hydrated. 4. If you have a medical appointment, call the healthcare provider ahead of time and tell them that you have or may have COVID-19. 5. For medical emergencies, call 911 and notify the dispatch personnel that you have or may have COVID-19. 6. Cover your cough and sneezes with a tissue or use the inside of your elbow. 7. Wash your hands often with soap and water for at least 20 seconds or clean your hands with an alcohol-based hand sanitizer that contains at least 60% alcohol. 8. As much as possible, stay in a specific room and away from other people in your home. Also, you should use a separate bathroom, if available. If you need to be around other people in or outside of the home, wear a mask. 9. Avoid sharing personal items with other people in your household, like dishes, towels, and bedding. 10. Clean all surfaces that are touched often, like counters, tabletops, and doorknobs. Use household cleaning sprays or wipes according to the label instructions. cdc.gov/coronavirus 05/23/2019 This information is not intended to replace advice given to you by your health care provider. Make sure you discuss any questions you have with your health care provider. Document Revised: 10/25/2019 Document Reviewed: 10/25/2019 Elsevier Patient Education  2020 [employer]  

## 2020-03-22 ENCOUNTER — Ambulatory Visit (HOSPITAL_COMMUNITY)
Admission: RE | Admit: 2020-03-22 | Discharge: 2020-03-22 | Disposition: A | Discharge status: Home / Self Care | Payer: BC Managed Care – PPO | Source: Ambulatory Visit | Attending: Pulmonary Disease | Admitting: Pulmonary Disease

## 2020-03-22 DIAGNOSIS — U071 COVID-19: Secondary | ICD-10-CM | POA: Insufficient documentation

## 2020-03-22 DIAGNOSIS — J1282 Pneumonia due to coronavirus disease 2019: Secondary | ICD-10-CM | POA: Diagnosis not present

## 2020-03-22 LAB — CULTURE, BLOOD (ROUTINE X 2)
Culture: NO GROWTH
Culture: NO GROWTH
Special Requests: ADEQUATE
Special Requests: ADEQUATE

## 2020-03-22 MED ORDER — FAMOTIDINE IN NACL 20-0.9 MG/50ML-% IV SOLN: 20.0000 mg | Freq: Once | INTRAVENOUS | Status: DC | PRN

## 2020-03-22 MED ORDER — EPINEPHRINE 0.3 MG/0.3ML IJ SOAJ: 0.3000 mg | Freq: Once | INTRAMUSCULAR | Status: DC | PRN

## 2020-03-22 MED ORDER — ALBUTEROL SULFATE HFA 108 (90 BASE) MCG/ACT IN AERS: 2.0000 | INHALATION_SPRAY | Freq: Once | RESPIRATORY_TRACT | Status: DC | PRN

## 2020-03-22 MED ORDER — SODIUM CHLORIDE 0.9 % IV SOLN
100.0000 mg | Freq: Once | INTRAVENOUS | Status: AC
  Administered 2020-03-22: 100 mg via INTRAVENOUS

## 2020-03-22 MED ORDER — DIPHENHYDRAMINE HCL 50 MG/ML IJ SOLN: 50.0000 mg | Freq: Once | INTRAMUSCULAR | Status: DC | PRN

## 2020-03-22 MED ORDER — SODIUM CHLORIDE 0.9 % IV SOLN: INTRAVENOUS | Status: DC | PRN

## 2020-03-22 MED ORDER — METHYLPREDNISOLONE SODIUM SUCC 125 MG IJ SOLR: 125.0000 mg | Freq: Once | INTRAMUSCULAR | Status: DC | PRN

## 2020-03-22 NOTE — Discharge Instructions (Signed)
COVID-19 COVID-19 is a respiratory infection that is caused by a virus called severe acute respiratory syndrome coronavirus 2 (SARS-CoV-2). The disease is also known as coronavirus disease or novel coronavirus. In some people, the virus may not cause any symptoms. In others, it may cause a serious infection. The infection can get worse quickly and can lead to complications, such as:  Pneumonia, or infection of the lungs.  Acute respiratory distress syndrome or ARDS. This is a condition in which fluid build-up in the lungs prevents the lungs from filling with air and passing oxygen into the blood.  Acute respiratory failure. This is a condition in which there is not enough oxygen passing from the lungs to the body or when carbon dioxide is not passing from the lungs out of the body.  Sepsis or septic shock. This is a serious bodily reaction to an infection.  Blood clotting problems.  Secondary infections due to bacteria or fungus.  Organ failure. This is when your body's organs stop working. The virus that causes COVID-19 is contagious. This means that it can spread from person to person through droplets from coughs and sneezes (respiratory secretions). What are the causes? This illness is caused by a virus. You may catch the virus by:  Breathing in droplets from an infected person. Droplets can be spread by a person breathing, speaking, singing, coughing, or sneezing.  Touching something, like a table or a doorknob, that was exposed to the virus (contaminated) and then touching your mouth, nose, or eyes. What increases the risk? Risk for infection You are more likely to be infected with this virus if you:  Are within 6 feet (2 meters) of a person with COVID-19.  Provide care for or live with a person who is infected with COVID-19.  Spend time in crowded indoor spaces or live in shared housing. Risk for serious illness You are more likely to become seriously ill from the virus if you:   Are 50 years of age or older. The higher your age, the more you are at risk for serious illness.  Live in a nursing home or long-term care facility.  Have cancer.  Have a long-term (chronic) disease such as: ? Chronic lung disease, including chronic obstructive pulmonary disease or asthma. ? A long-term disease that lowers your body's ability to fight infection (immunocompromised). ? Heart disease, including heart failure, a condition in which the arteries that lead to the heart become narrow or blocked (coronary artery disease), a disease which makes the heart muscle thick, weak, or stiff (cardiomyopathy). ? Diabetes. ? Chronic kidney disease. ? Sickle cell disease, a condition in which red blood cells have an abnormal "sickle" shape. ? Liver disease.  Are obese. What are the signs or symptoms? Symptoms of this condition can range from mild to severe. Symptoms may appear any time from 2 to 14 days after being exposed to the virus. They include:  A fever or chills.  A cough.  Difficulty breathing.  Headaches, body aches, or muscle aches.  Runny or stuffy (congested) nose.  A sore throat.  New loss of taste or smell. Some people may also have stomach problems, such as nausea, vomiting, or diarrhea. Other people may not have any symptoms of COVID-19. How is this diagnosed? This condition may be diagnosed based on:  Your signs and symptoms, especially if: ? You live in an area with a COVID-19 outbreak. ? You recently traveled to or from an area where the virus is common. ? You   provide care for or live with a person who was diagnosed with COVID-19. ? You were exposed to a person who was diagnosed with COVID-19.  A physical exam.  Lab tests, which may include: ? Taking a sample of fluid from the back of your nose and throat (nasopharyngeal fluid), your nose, or your throat using a swab. ? A sample of mucus from your lungs (sputum). ? Blood tests.  Imaging tests, which  may include, X-rays, CT scan, or ultrasound. How is this treated? At present, there is no medicine to treat COVID-19. Medicines that treat other diseases are being used on a trial basis to see if they are effective against COVID-19. Your health care provider will talk with you about ways to treat your symptoms. For most people, the infection is mild and can be managed at home with rest, fluids, and over-the-counter medicines. Treatment for a serious infection usually takes places in a hospital intensive care unit (ICU). It may include one or more of the following treatments. These treatments are given until your symptoms improve.  Receiving fluids and medicines through an IV.  Supplemental oxygen. Extra oxygen is given through a tube in the nose, a face mask, or a hood.  Positioning you to lie on your stomach (prone position). This makes it easier for oxygen to get into the lungs.  Continuous positive airway pressure (CPAP) or bi-level positive airway pressure (BPAP) machine. This treatment uses mild air pressure to keep the airways open. A tube that is connected to a motor delivers oxygen to the body.  Ventilator. This treatment moves air into and out of the lungs by using a tube that is placed in your windpipe.  Tracheostomy. This is a procedure to create a hole in the neck so that a breathing tube can be inserted.  Extracorporeal membrane oxygenation (ECMO). This procedure gives the lungs a chance to recover by taking over the functions of the heart and lungs. It supplies oxygen to the body and removes carbon dioxide. Follow these instructions at home: Lifestyle  If you are sick, stay home except to get medical care. Your health care provider will tell you how long to stay home. Call your health care provider before you go for medical care.  Rest at home as told by your health care provider.  Do not use any products that contain nicotine or tobacco, such as cigarettes, e-cigarettes, and  chewing tobacco. If you need help quitting, ask your health care provider.  Return to your normal activities as told by your health care provider. Ask your health care provider what activities are safe for you. General instructions  Take over-the-counter and prescription medicines only as told by your health care provider.  Drink enough fluid to keep your urine pale yellow.  Keep all follow-up visits as told by your health care provider. This is important. How is this prevented?  There is no vaccine to help prevent COVID-19 infection. However, there are steps you can take to protect yourself and others from this virus. To protect yourself:   Do not travel to areas where COVID-19 is a risk. The areas where COVID-19 is reported change often. To identify high-risk areas and travel restrictions, check the CDC travel website: wwwnc.cdc.gov/travel/notices  If you live in, or must travel to, an area where COVID-19 is a risk, take precautions to avoid infection. ? Stay away from people who are sick. ? Wash your hands often with soap and water for 20 seconds. If soap and water   are not available, use an alcohol-based hand sanitizer. ? Avoid touching your mouth, face, eyes, or nose. ? Avoid going out in public, follow guidance from your state and local health authorities. ? If you must go out in public, wear a cloth face covering or face mask. Make sure your mask covers your nose and mouth. ? Avoid crowded indoor spaces. Stay at least 6 feet (2 meters) away from others. ? Disinfect objects and surfaces that are frequently touched every day. This may include:  Counters and tables.  Doorknobs and light switches.  Sinks and faucets.  Electronics, such as phones, remote controls, keyboards, computers, and tablets. To protect others: If you have symptoms of COVID-19, take steps to prevent the virus from spreading to others.  If you think you have a COVID-19 infection, contact your health care  provider right away. Tell your health care team that you think you may have a COVID-19 infection.  Stay home. Leave your house only to seek medical care. Do not use public transport.  Do not travel while you are sick.  Wash your hands often with soap and water for 20 seconds. If soap and water are not available, use alcohol-based hand sanitizer.  Stay away from other members of your household. Let healthy household members care for children and pets, if possible. If you have to care for children or pets, wash your hands often and wear a mask. If possible, stay in your own room, separate from others. Use a different bathroom.  Make sure that all people in your household wash their hands well and often.  Cough or sneeze into a tissue or your sleeve or elbow. Do not cough or sneeze into your hand or into the air.  Wear a cloth face covering or face mask. Make sure your mask covers your nose and mouth. Where to find more information  Centers for Disease Control and Prevention: www.cdc.gov/coronavirus/2019-ncov/index.html  World Health Organization: www.who.int/health-topics/coronavirus Contact a health care provider if:  You live in or have traveled to an area where COVID-19 is a risk and you have symptoms of the infection.  You have had contact with someone who has COVID-19 and you have symptoms of the infection. Get help right away if:  You have trouble breathing.  You have pain or pressure in your chest.  You have confusion.  You have bluish lips and fingernails.  You have difficulty waking from sleep.  You have symptoms that get worse. These symptoms may represent a serious problem that is an emergency. Do not wait to see if the symptoms will go away. Get medical help right away. Call your local emergency services (911 in the [place] Do not drive yourself to the hospital. Let the emergency medical personnel know if you think you have COVID-19. Summary  COVID-19 is a  respiratory infection that is caused by a virus. It is also known as coronavirus disease or novel coronavirus. It can cause serious infections, such as pneumonia, acute respiratory distress syndrome, acute respiratory failure, or sepsis.  The virus that causes COVID-19 is contagious. This means that it can spread from person to person through droplets from breathing, speaking, singing, coughing, or sneezing.  You are more likely to develop a serious illness if you are 50 years of age or older, have a weak immune system, live in a nursing home, or have chronic disease.  There is no medicine to treat COVID-19. Your health care provider will talk with you about ways to treat your symptoms.    Take steps to protect yourself and others from infection. Wash your hands often and disinfect objects and surfaces that are frequently touched every day. Stay away from people who are sick and wear a mask if you are sick. This information is not intended to replace advice given to you by your health care provider. Make sure you discuss any questions you have with your health care provider. Document Revised: 09/07/2019 Document Reviewed: 12/14/2018 [employer] Patient Education  2020 [employer]  

## 2020-03-22 NOTE — Progress Notes (Signed)
  Diagnosis: COVID-19  Physician:  Procedure: [facility] Med: remdesivir infusion - Provided patient with remdesivir fact sheet for patients, parents and caregivers prior to infusion.  Complications: No immediate complications noted.  Discharge: Discharged home   Derek Khan 03/22/2020

## 2020-04-01 ENCOUNTER — Encounter: Payer: Self-pay | Admitting: Family Medicine

## 2020-04-01 ENCOUNTER — Ambulatory Visit: Payer: BC Managed Care – PPO | Admitting: Family Medicine

## 2020-04-01 ENCOUNTER — Other Ambulatory Visit: Payer: Self-pay

## 2020-04-01 VITALS — BP 102/76 | HR 100 | Temp 96.6°F | Resp 16 | Ht 67.0 in | Wt 326.8 lb

## 2020-04-01 DIAGNOSIS — Z09 Encounter for follow-up examination after completed treatment for conditions other than malignant neoplasm: Secondary | ICD-10-CM

## 2020-04-01 DIAGNOSIS — Z8616 Personal history of COVID-19: Secondary | ICD-10-CM

## 2020-04-01 NOTE — Progress Notes (Signed)
Name: Derek Khan   MRN: [id number]    DOB: July 18, 1991   Date:04/01/2020       Progress Note  Subjective  Chief Complaint  Chief Complaint  Patient presents with  . Hospitalization Follow-up    HPI  Hospital Discharge Follow up: he tested positive for COVID-19 on 04/19. Admitted to [facility] on 03/17/2020 and discharged on 03/20/2020  He states symptoms started with mild cough, headache, fever, chills, lack of appetite,decrease sense of smell and taste also had diarrhea. He developed SOB and wheezing, had an e-visit on April 23 rd and was given Zpack and tessalon perles. He did not improve and vomited one hour after second dose of zpack. Wife noticed that his sob was worse, wheezing was louder so she called 911 , EMS checked pulse ox was down to 82 % he was given oxygen through nasal cannula and transported to [facility] , upon arrival pulse ox was up to 92 % heart rate was high and respiratory rate was also.  He received 7 days on steroids, and 5 days of Remdesivir - 3 while at [facility] He states he is feeling much better  and wants to go back to work , it has been 21 days since diagnosis. He still has occasional wheezing, cough has resolved, very mild SOB when going up a flight of steps.   Patient Active Problem List   Diagnosis Date Noted  . Acute hypoxemic respiratory failure due to COVID-19 (HCC) 03/17/2020  . Snoring 02/13/2019  . Nocturnal enuresis 02/13/2019  . Fever blister 07/19/2017  . History of depression 01/27/2016  . Morbid obesity (HCC) 01/27/2016    History reviewed. No pertinent surgical history.  Family History  Problem Relation Age of Onset  . Depression Mother   . Obesity Mother   . Obesity Father   . Obesity Sister   . ADD / ADHD Brother   . Obesity Brother     Social History   Tobacco Use  . Smoking status: Never Smoker  . Smokeless tobacco: Never Used  Substance Use Topics  . Alcohol use: Yes    Alcohol/week: 1.0 standard drinks    Types: 1 Standard  drinks or equivalent per week    Comment: social drinker     Current Outpatient Medications:  .  acetaminophen (TYLENOL) 500 MG tablet, Take 500 mg by mouth every 6 (six) hours as needed., Disp: , Rfl:  .  valACYclovir (VALTREX) 1000 MG tablet, Take 1 tablet (1,000 mg total) by mouth 2 (two) times daily as needed. Prn outbreak, Disp: , Rfl:  .  albuterol (PROVENTIL) (2.5 MG/3ML) 0.083% nebulizer solution, Take 2.5 mg by nebulization every 6 (six) hours as needed. , Disp: , Rfl:   No Known Allergies  I personally reviewed active problem list, medication list, allergies, family history, social history, health maintenance with the patient/caregiver today.   ROS  Constitutional: Negative for fever or weight change.  Respiratory: Negative for cough and shortness of breath.   Cardiovascular: Negative for chest pain or palpitations.  Gastrointestinal: Negative for abdominal pain, no bowel changes.  Musculoskeletal: Negative for gait problem or joint swelling.  Skin: Negative for rash.  Neurological: Negative for dizziness or headache.  No other specific complaints in a complete review of systems (except as listed in HPI above).  Objective  Vitals:   04/01/20 1428  BP: 102/76  Pulse: 100  Resp: 16  Temp: (!) 96.6 F (35.9 C)  TempSrc: Temporal  SpO2: 97%  Weight: (!) 326  lb 12.8 oz (148.2 kg)  Height:  (1.702 m)    Body mass index is 51.18 kg/m.  Physical Exam  Constitutional: Patient appears well-developed and well-nourished. Obese  No distress.  HEENT: head atraumatic, normocephalic, pupils equal and reactive to light Cardiovascular: Normal rate, regular rhythm and normal heart sounds.  No murmur heard. No BLE edema. Pulmonary/Chest: Effort normal and breath sounds normal. No respiratory distress. Abdominal: Soft.  There is no tenderness. Psychiatric: Patient has a normal mood and affect. behavior is normal. Judgment and thought content normal.  Recent Results  (from the past 2160 hour(s))  ABO/Rh     Status: None   Collection Time: 03/17/20 12:07 AM  Result Value Ref Range   ABO/RH(D)      O POS Performed at [facility], [address] [place], [place] [address]   Troponin I (High Sensitivity)     Status: None   Collection Time: 03/17/20 10:20 PM  Result Value Ref Range   Troponin I (High Sensitivity) 3 <18 ng/L    Comment: (NOTE) Elevated high sensitivity troponin I (hsTnI) values and significant  changes across serial measurements may suggest ACS but many other  chronic and acute conditions are known to elevate hsTnI results.  Refer to the "Links" section for chest pain algorithms and additional  guidance. Performed at [facility], [address] [place], [place] [address]   CBC with Differential     Status: Abnormal   Collection Time: 03/17/20 10:20 PM  Result Value Ref Range   WBC 7.6 4.0 - 10.5 K/uL   RBC 5.20 4.22 - 5.81 MIL/uL   Hemoglobin 13.2 13.0 - 17.0 g/dL   HCT 82.9 56.2 - 13.0 %   MCV 77.1 (L) 80.0 - 100.0 fL   MCH 25.4 (L) 26.0 - 34.0 pg   MCHC 32.9 30.0 - 36.0 g/dL   RDW 86.5 78.4 - 69.6 %   Platelets 174 150 - 400 K/uL   nRBC 0.0 0.0 - 0.2 %   Neutrophils Relative % 78 %   Neutro Abs 5.9 1.7 - 7.7 K/uL   Lymphocytes Relative 13 %   Lymphs Abs 1.0 0.7 - 4.0 K/uL   Monocytes Relative 9 %   Monocytes Absolute 0.7 0.1 - 1.0 K/uL   Eosinophils Relative 0 %   Eosinophils Absolute 0.0 0.0 - 0.5 K/uL   Basophils Relative 0 %   Basophils Absolute 0.0 0.0 - 0.1 K/uL   Immature Granulocytes 0 %   Abs Immature Granulocytes 0.02 0.00 - 0.07 K/uL    Comment: Performed at [facility], [address] [place], [place] [address]  Comprehensive metabolic panel     Status: Abnormal   Collection Time: 03/17/20 10:20 PM  Result Value Ref Range   Sodium 138 135 - 145 mmol/L   Potassium 3.8 3.5 - 5.1 mmol/L   Chloride 102 98 - 111 mmol/L   CO2 25 22 - 32 mmol/L   Glucose, Bld 99 70 - 99  mg/dL    Comment: Glucose reference range applies only to samples taken after fasting for at least 8 hours.   BUN 9 6 - 20 mg/dL   Creatinine, Ser 4.13 0.61 - 1.24 mg/dL   Calcium 8.8 (L) 8.9 - 10.3 mg/dL   Total Protein 7.5 6.5 - 8.1 g/dL   Albumin 3.6 3.5 - 5.0 g/dL   AST 32 15 - 41 U/L   ALT 37 0 - 44 U/L   Alkaline Phosphatase 51  38 - 126 U/L   Total Bilirubin 0.7 0.3 - 1.2 mg/dL   GFR calc non Af Amer >60 >60 mL/min   GFR calc Af Amer >60 >60 mL/min   Anion gap 11 5 - 15    Comment: Performed at [facility], [address] [place], [place] [address]  Procalcitonin - Baseline     Status: None   Collection Time: 03/17/20 10:20 PM  Result Value Ref Range   Procalcitonin <0.10 ng/mL    Comment:        Interpretation: PCT (Procalcitonin) <= 0.5 ng/mL: Systemic infection (sepsis) is not likely. Local bacterial infection is possible. (NOTE)       Sepsis PCT Algorithm           Lower Respiratory Tract                                      Infection PCT Algorithm    ----------------------------     ----------------------------         PCT < 0.25 ng/mL                PCT < 0.10 ng/mL         Strongly encourage             Strongly discourage   discontinuation of antibiotics    initiation of antibiotics    ----------------------------     -----------------------------       PCT 0.25 - 0.50 ng/mL            PCT 0.10 - 0.25 ng/mL               OR       >80% decrease in PCT            Discourage initiation of                                            antibiotics      Encourage discontinuation           of antibiotics    ----------------------------     -----------------------------         PCT >= 0.50 ng/mL              PCT 0.26 - 0.50 ng/mL               AND        <80% decrease in PCT             Encourage initiation of                                             antibiotics       Encourage continuation           of antibiotics    ----------------------------      -----------------------------        PCT >= 0.50 ng/mL                  PCT > 0.50 ng/mL               AND         increase in PCT  Strongly encourage                                      initiation of antibiotics    Strongly encourage escalation           of antibiotics                                     -----------------------------                                           PCT <= 0.25 ng/mL                                                 OR                                        > 80% decrease in PCT                                     Discontinue / Do not initiate                                             antibiotics Performed at [facility], [address] [place], [place] [address]   Respiratory Panel by RT PCR (Flu A&B, Covid) - Nasopharyngeal Swab     Status: Abnormal   Collection Time: 03/17/20 11:06 PM   Specimen: Nasopharyngeal Swab  Result Value Ref Range   SARS Coronavirus 2 by RT PCR POSITIVE (A) NEGATIVE    Comment: RESULT CALLED TO, READ BACK BY AND VERIFIED WITH: BRANDY MILLER 0130 03/18/20 AKT (NOTE) SARS-CoV-2 target nucleic acids are DETECTED. SARS-CoV-2 RNA is generally detectable in upper respiratory specimens  during the acute phase of infection. Positive results are indicative of the presence of the identified virus, but do not rule out bacterial infection or co-infection with other pathogens not detected by the test. Clinical correlation with patient history and other diagnostic information is necessary to determine patient infection status. The expected result is Negative. Fact Sheet for Patients:  https://www.moore.com/ Fact Sheet for Healthcare Providers: https://www.young.biz/ This test is not yet approved or cleared by the [place] FDA and  has been authorized for detection and/or diagnosis of SARS-CoV-2 by FDA under an Emergency Use Authorization (EUA).  This EUA will remain  in effect (meaning this test can be used) for th e duration of  the COVID-19 declaration under Section 564(b)(1) of the Act, 21 U.S.C. section 360bbb-3(b)(1), unless the authorization is terminated or revoked sooner.    Influenza A by PCR NEGATIVE NEGATIVE   Influenza B by PCR NEGATIVE NEGATIVE    Comment: (NOTE) The Xpert Xpress SARS-CoV-2/FLU/RSV assay is intended as an aid in  the diagnosis of influenza from Nasopharyngeal swab specimens and  should not be used as a sole basis for treatment. Nasal washings and  aspirates are unacceptable for Xpert Xpress SARS-CoV-2/FLU/RSV  testing. Fact Sheet for Patients: https://www.moore.com/https://www.fda.gov/media/142436/download Fact Sheet for Healthcare Providers: https://www.young.biz/https://www.fda.gov/media/142435/download This test is not yet approved or cleared by the [place] FDA and  has been authorized for detection and/or diagnosis of SARS-CoV-2 by  FDA under an Emergency Use Authorization (EUA). This EUA will remain  in effect (meaning this test can be used) for the duration of the  Covid-19 declaration under Section 564(b)(1) of the Act, 21  U.S.C. section 360bbb-3(b)(1), unless the authorization is  terminated or revoked. Performed at [facility], [address] [place], [place] [address]   Troponin I (High Sensitivity)     Status: None   Collection Time: 03/17/20 11:37 PM  Result Value Ref Range   Troponin I (High Sensitivity) 4 <18 ng/L    Comment: (NOTE) Elevated high sensitivity troponin I (hsTnI) values and significant  changes across serial measurements may suggest ACS but many other  chronic and acute conditions are known to elevate hsTnI results.  Refer to the "Links" section for chest pain algorithms and additional  guidance. Performed at [facility], [address] [place], [place] [address]   Blood Culture (routine x 2)     Status: None   Collection Time: 03/17/20 11:37 PM   Specimen: BLOOD  Result Value Ref Range    Specimen Description BLOOD LEFT ANTECUBITAL    Special Requests      BOTTLES DRAWN AEROBIC AND ANAEROBIC Blood Culture adequate volume   Culture      NO GROWTH 5 DAYS Performed at [facility], [address] [place], [place] [address]    Report Status 03/22/2020 FINAL   Blood Culture (routine x 2)     Status: None   Collection Time: 03/17/20 11:37 PM   Specimen: BLOOD  Result Value Ref Range   Specimen Description BLOOD RIGHT ANTECUBITAL    Special Requests      BOTTLES DRAWN AEROBIC AND ANAEROBIC Blood Culture adequate volume   Culture      NO GROWTH 5 DAYS Performed at [facility], [address] [place], [place] [address]    Report Status 03/22/2020 FINAL   HIV Antibody (routine testing w rflx)     Status: None   Collection Time: 03/18/20  5:58 AM  Result Value Ref Range   HIV Screen 4th Generation wRfx Non Reactive Non Reactive    Comment: Performed at [facility], [address] [place], [place] [address]  C-reactive protein     Status: Abnormal   Collection Time: 03/18/20  5:58 AM  Result Value Ref Range   CRP 16.6 (H) <1.0 mg/dL    Comment: Performed at [facility], [address] [place], [place] [address]  Fibrin derivatives D-Dimer [facility] only)     Status: Abnormal   Collection Time: 03/18/20  5:58 AM  Result Value Ref Range   Fibrin derivatives D-dimer ([facility]) 1,408.52 (H) 0.00 - 499.00 ng/mL (FEU)    Comment: (NOTE) <> Exclusion of Venous Thromboembolism (VTE) - OUTPATIENT ONLY   (Emergency Department or [place])   0-499 ng/ml (FEU): With a low to intermediate pretest probability                      for VTE this test result excludes the diagnosis  of VTE.   >499 ng/ml (FEU) : VTE not excluded; additional work up for VTE is                      required. <> Testing on Inpatients and Evaluation of Disseminated Intravascular   Coagulation (DIC) Reference Range:   0-499 ng/ml (FEU) Performed at [facility], [address] [place], [place] [address]   Ferritin     Status: Abnormal   Collection Time: 03/18/20  5:58 AM  Result Value Ref Range   Ferritin 471 (H) 24 - 336 ng/mL    Comment: Performed at [facility], [address] [place], [place] [address]  Lactate dehydrogenase     Status: Abnormal   Collection Time: 03/18/20  5:58 AM  Result Value Ref Range   LDH 236 (H) 98 - 192 U/L    Comment: Performed at [facility], [address] [place], [place] [address]  CBC with Differential/Platelet     Status: Abnormal   Collection Time: 03/19/20  4:11 AM  Result Value Ref Range   WBC 5.4 4.0 - 10.5 K/uL   RBC 5.15 4.22 - 5.81 MIL/uL   Hemoglobin 13.0 13.0 - 17.0 g/dL   HCT 40.1 39.0 - 52.0 %   MCV 77.9 (L) 80.0 - 100.0 fL   MCH 25.2 (L) 26.0 - 34.0 pg   MCHC 32.4 30.0 - 36.0 g/dL   RDW 13.4 11.5 - 15.5 %   Platelets 248 150 - 400 K/uL   nRBC 0.0 0.0 - 0.2 %   Neutrophils Relative % 81 %   Neutro Abs 4.4 1.7 - 7.7 K/uL   Lymphocytes Relative 13 %   Lymphs Abs 0.7 0.7 - 4.0 K/uL   Monocytes Relative 5 %   Monocytes Absolute 0.3 0.1 - 1.0 K/uL   Eosinophils Relative 0 %   Eosinophils Absolute 0.0 0.0 - 0.5 K/uL   Basophils Relative 0 %   Basophils Absolute 0.0 0.0 - 0.1 K/uL   WBC Morphology MORPHOLOGY UNREMARKABLE    RBC Morphology MORPHOLOGY UNREMARKABLE    Smear Review Normal platelet morphology    Immature Granulocytes 1 %   Abs Immature Granulocytes 0.03 0.00 - 0.07 K/uL    Comment: Performed at [facility], [address] [place], [place] [address]  Comprehensive metabolic panel     Status: Abnormal   Collection Time: 03/19/20  4:11 AM  Result Value Ref Range   Sodium 140 135 - 145 mmol/L   Potassium 4.3 3.5 - 5.1 mmol/L   Chloride 105 98 - 111 mmol/L   CO2 26 22 - 32 mmol/L   Glucose, Bld 136 (H) 70 - 99 mg/dL    Comment: Glucose reference range applies only to samples taken after fasting for at least 8 hours.   BUN 15 6  - 20 mg/dL   Creatinine, Ser 0.58 (L) 0.61 - 1.24 mg/dL   Calcium 9.1 8.9 - 10.3 mg/dL   Total Protein 7.8 6.5 - 8.1 g/dL   Albumin 3.7 3.5 - 5.0 g/dL   AST 29 15 - 41 U/L   ALT 43 0 - 44 U/L   Alkaline Phosphatase 52 38 - 126 U/L   Total Bilirubin 0.6 0.3 - 1.2 mg/dL   GFR calc non Af Amer >60 >60 mL/min   GFR calc Af Amer >60 >60 mL/min   Anion gap 9 5 - 15    Comment: Performed at [facility], [address] [place], [place] [address]  C-reactive protein     Status: Abnormal   Collection Time: 03/19/20  4:11 AM  Result Value Ref Range   CRP 9.1 (H) <1.0 mg/dL    Comment: Performed at [facility], [address] [place], [place] [address]  Fibrin derivatives D-Dimer [facility] only)     Status: Abnormal   Collection Time: 03/19/20  4:11 AM  Result Value Ref Range   Fibrin derivatives D-dimer ([facility]) 1,315.48 (H) 0.00 - 499.00 ng/mL (FEU)    Comment: (NOTE) <> Exclusion of Venous Thromboembolism (VTE) - OUTPATIENT ONLY   (Emergency Department or [place])   0-499 ng/ml (FEU): With a low to intermediate pretest probability                      for VTE this test result excludes the diagnosis                      of VTE.   >499 ng/ml (FEU) : VTE not excluded; additional work up for VTE is                      required. <> Testing on Inpatients and Evaluation of Disseminated Intravascular   Coagulation (DIC) Reference Range:   0-499 ng/ml (FEU) Performed at [facility], [address] [place], [place] [address]   Ferritin     Status: Abnormal   Collection Time: 03/19/20  4:11 AM  Result Value Ref Range   Ferritin 428 (H) 24 - 336 ng/mL    Comment: Performed at [facility], [address] [place], [place] [address]  C-reactive protein     Status: Abnormal   Collection Time: 03/20/20  4:40 AM  Result Value Ref Range   CRP 4.0 (H) <1.0 mg/dL    Comment: Performed at [facility], [address], [place], [place] [address]  Basic  metabolic panel     Status: Abnormal   Collection Time: 03/20/20  4:40 AM  Result Value Ref Range   Sodium 140 135 - 145 mmol/L   Potassium 4.4 3.5 - 5.1 mmol/L   Chloride 107 98 - 111 mmol/L   CO2 24 22 - 32 mmol/L   Glucose, Bld 140 (H) 70 - 99 mg/dL    Comment: Glucose reference range applies only to samples taken after fasting for at least 8 hours.   BUN 15 6 - 20 mg/dL   Creatinine, Ser 3.15 0.61 - 1.24 mg/dL   Calcium 9.3 8.9 - 17.6 mg/dL   GFR calc non Af Amer >60 >60 mL/min   GFR calc Af Amer >60 >60 mL/min   Anion gap 9 5 - 15    Comment: Performed at [facility], [address] [place], [place] [address]  CBC     Status: Abnormal   Collection Time: 03/20/20  4:40 AM  Result Value Ref Range   WBC 7.6 4.0 - 10.5 K/uL   RBC 5.42 4.22 - 5.81 MIL/uL   Hemoglobin 13.7 13.0 - 17.0 g/dL   HCT 71.0 62.6 - 94.8 %   MCV 76.0 (L) 80.0 - 100.0 fL   MCH 25.3 (L) 26.0 - 34.0 pg   MCHC 33.3 30.0 - 36.0 g/dL   RDW 54.6 27.0 - 35.0 %   Platelets 309 150 - 400 K/uL   nRBC 0.0 0.0 - 0.2 %    Comment: Performed at [facility], [address] [place], [place] [address]  Magnesium     Status: None   Collection Time: 03/20/20  4:40 AM  Result Value Ref Range   Magnesium 2.3 1.7 - 2.4 mg/dL    Comment: Performed at [facility], [address] [place], [place] [address]    PHQ2/9: Depression screen [facility] 2/9 04/01/2020 03/16/2019 02/13/2019 05/22/2018 12/19/2017  Decreased Interest 0 0 1 0 0  Down, Depressed, Hopeless 0 0 0 0 0  PHQ - 2 Score 0 0 1 0 0  Altered sleeping 0 0 0 2 -  Tired, decreased energy 0 0 2 1 -  Change in appetite 0 0 3 1 -  Feeling bad or failure about yourself  0 0 0 0 -  Trouble concentrating 0 0 0 0 -  Moving slowly or fidgety/restless 0 0 0 0 -  Suicidal thoughts 0 0 - 0 -  PHQ-9 Score 0 0 6 4 -  Difficult doing work/chores - Not difficult at all Somewhat difficult Not difficult at all -    phq 9 is negative   Fall  Risk: Fall Risk  04/01/2020 03/16/2019 05/22/2018 12/19/2017 01/27/2016  Falls in the past year? 0 0 No No No  Number falls in past yr: 0 0 - - -  Injury with Fall? 0 0 - - -  Follow up - Falls evaluation completed - - -     Functional Status Survey: Is the patient deaf or have difficulty hearing?: No Does the patient have difficulty seeing, even when wearing glasses/contacts?: No Does the patient have difficulty concentrating, remembering, or making decisions?: No Does the patient have difficulty walking or climbing stairs?: No Does the patient have difficulty dressing or bathing?: No Does the patient have difficulty doing errands alone such as visiting a doctor's office or shopping?: No   Assessment & Plan  1. Hospital discharge follow-up  Discussed labs and reviewed studies, may go back to work, we decided not to repeat inflammatory markers since he is feeling better and levels were trending down prior to discharge   2. History of COVID-19  Doing well, he will get vaccine in July

## 2020-05-07 NOTE — Addendum Note (Signed)
Addended by: Alba Cory F on: 05/07/2020 08:21 AM   Modules accepted: Level of Service

## 2020-07-10 ENCOUNTER — Encounter: Payer: BC Managed Care – PPO | Admitting: Family Medicine

## 2020-11-26 ENCOUNTER — Encounter: Payer: BC Managed Care – PPO | Admitting: Family Medicine

## 2021-02-16 NOTE — Progress Notes (Signed)
Name: Derek Khan   MRN: [id number]    DOB: 11-07-1991   Date:02/17/2021       Progress Note  Subjective  Chief Complaint  Annual Exam  HPI  Patient presents for annual CPE and follow up.    IPSS Questionnaire (AUA-7): Over the past month.   1)  How often have you had a sensation of not emptying your bladder completely after you finish urinating?  0 - Not at all  2)  How often have you had to urinate again less than two hours after you finished urinating? 3 - About half the time  3)  How often have you found you stopped and started again several times when you urinated?  2- sometimes, end of void   4) How difficult have you found it to postpone urination?  0 - Not at all  5) How often have you had a weak urinary stream?  0 - Not at all  6) How often have you had to push or strain to begin urination?  0 - Not at all  7) How many times did you most typically get up to urinate from the time you went to bed until the time you got up in the morning?  2 - 2 times  Total score:  0-7 mildly symptomatic   8-19 moderately symptomatic   20-35 severely symptomatic    He states he drinks a lot of water, always goes to the bathroom shortly after going to bed and early in am  Diet: trying to eat healthier Exercise: going to the gym over the past 6 weeks about 2-3 times a week for about one hour   Depression: phq 9 is negative Depression screen [facility] 2/9 02/17/2021 04/01/2020 03/16/2019 02/13/2019 05/22/2018  Decreased Interest 0 0 0 1 0  Down, Depressed, Hopeless 0 0 0 0 0  PHQ - 2 Score 0 0 0 1 0  Altered sleeping 0 0 0 0 2  Tired, decreased energy 0 0 0 2 1  Change in appetite 0 0 0 3 1  Feeling bad or failure about yourself  0 0 0 0 0  Trouble concentrating 0 0 0 0 0  Moving slowly or fidgety/restless 0 0 0 0 0  Suicidal thoughts 0 0 0 - 0  PHQ-9 Score 0 0 0 6 4  Difficult doing work/chores - - Not difficult at all Somewhat difficult Not difficult at all    Hypertension:  BP  Readings from Last 3 Encounters:  02/17/21 132/80  04/01/20 102/76  03/22/20 110/74    Obesity: Wt Readings from Last 3 Encounters:  02/17/21 (!) 332 lb 4.8 oz (150.7 kg)  04/01/20 (!) 326 lb 12.8 oz (148.2 kg)  03/17/20 (!) 322 lb (146.1 kg)   BMI Readings from Last 3 Encounters:  02/17/21 52.05 kg/m  04/01/20 51.18 kg/m  03/17/20 50.43 kg/m     Lipids:  Lab Results  Component Value Date   CHOL 181 02/13/2019   CHOL 167 12/19/2017   CHOL 165 01/27/2016   Lab Results  Component Value Date   HDL 48 02/13/2019   HDL 45 12/19/2017   HDL 44 01/27/2016   Lab Results  Component Value Date   LDLCALC 112 (H) 02/13/2019   LDLCALC 104 (H) 12/19/2017   LDLCALC 104 (H) 01/27/2016   Lab Results  Component Value Date   TRIG 106 02/13/2019   TRIG 89 12/19/2017   TRIG 84 01/27/2016   Lab Results  Component Value Date  CHOLHDL 3.8 02/13/2019   CHOLHDL 3.7 12/19/2017   CHOLHDL 3.8 01/27/2016   No results found for: LDLDIRECT Glucose:  Glucose, Bld  Date Value Ref Range Status  03/20/2020 140 (H) 70 - 99 mg/dL Final    Comment:    Glucose reference range applies only to samples taken after fasting for at least 8 hours.  03/19/2020 136 (H) 70 - 99 mg/dL Final    Comment:    Glucose reference range applies only to samples taken after fasting for at least 8 hours.  03/17/2020 99 70 - 99 mg/dL Final    Comment:    Glucose reference range applies only to samples taken after fasting for at least 8 hours.    Flowsheet Row [facility]  AUDIT-C Score 1       Married STD testing and prevention (HIV/chl/gon/syphilis): not interested  Hep C: Ordered today  Skin cancer: Discussed monitoring for atypical lesions Colorectal cancer: N/A Prostate cancer: N/A  Vaccines:  HPV: up to at age 30 , ask insurance if age between 8927-45 - he states he had it before  Pneumonia: educated and discussed with patient. Flu: refused    Advanced Care Planning: A voluntary discussion about advance care planning including the explanation and discussion of advance directives.  Discussed health care proxy and Living will, and the patient was able to identify a health care proxy as wife    Patient Active Problem List   Diagnosis Date Noted  . Acute hypoxemic respiratory failure due to COVID-19 (HCC) 03/17/2020  . Snoring 02/13/2019  . Nocturnal enuresis 02/13/2019  . Fever blister 07/19/2017  . History of depression 01/27/2016  . Morbid obesity (HCC) 01/27/2016    History reviewed. No pertinent surgical history.  Family History  Problem Relation Age of Onset  . Depression Mother   . Obesity Mother   . Obesity Father   . Obesity Sister   . ADD / ADHD Brother   . Obesity Brother     Social History   Socioeconomic History  . Marital status: Married    Spouse name: Lesly RubensteinJade  . Number of children: 0  . Years of education: Not on file  . Highest education level: Some college, no degree  Occupational History  . Occupation: Database administratorwear house worker     Employer: [email or web address]    Comment: physical job  Tobacco Use  . Smoking status: Never Smoker  . Smokeless tobacco: Never Used  Vaping Use  . Vaping Use: Never used  Substance and Sexual Activity  . Alcohol use: Yes    Alcohol/week: 1.0 standard drink    Types: 1 Standard drinks or equivalent per week    Comment: social drinker  . Drug use: No  . Sexual activity: Yes    Partners: Female    Comment: wife takes opc  Other Topics Concern  . 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 Difficulty of Paying Living Expenses: Not hard at all  Food Insecurity: No Food Insecurity  . Worried About Programme researcher, broadcasting/film/videounning Out of Food in the Last Year: Never true  . Ran Out of Food in the Last Year: Never true  Transportation Needs: No  Transportation Needs  . Lack of Transportation (Medical): No  . Lack of Transportation (Non-Medical): No  Physical  Activity: Sufficiently Active  . Days of Exercise per Week: 3 days  . Minutes of Exercise per Session: 50 min  Stress: No Stress Concern Present  . Feeling of Stress : Not at all  Social Connections: Moderately Isolated  . Frequency of Communication with Friends and Family: More than three times a week  . Frequency of Social Gatherings with Friends and Family: Twice a week  . Attends Religious Services: Never  . Active Member of Clubs or Organizations: No  . Attends Banker Meetings: Never  . Marital Status: Married  Catering manager Violence: Not At Risk  . Fear of Current or Ex-Partner: No  . Emotionally Abused: No  . Physically Abused: No  . Sexually Abused: No     Current Outpatient Medications:  .  valACYclovir (VALTREX) 1000 MG tablet, Take 1 tablet (1,000 mg total) by mouth 2 (two) times daily as needed. Prn outbreak, Disp: , Rfl:  .  acetaminophen (TYLENOL) 500 MG tablet, Take 500 mg by mouth every 6 (six) hours as needed. (Patient not taking: Reported on 02/17/2021), Disp: , Rfl:  .  albuterol (PROVENTIL) (2.5 MG/3ML) 0.083% nebulizer solution, Take 2.5 mg by nebulization every 6 (six) hours as needed.  (Patient not taking: Reported on 02/17/2021), Disp: , Rfl:   No Known Allergies   ROS  Constitutional: Negative for fever, positive for  weight change.  Respiratory: Negative for cough and shortness of breath.   Cardiovascular: Negative for chest pain or palpitations.  Gastrointestinal: Negative for abdominal pain, no bowel changes.  Musculoskeletal: Negative for gait problem or joint swelling.  Skin: Negative for rash.  Neurological: Negative for dizziness or headache.  No other specific complaints in a complete review of systems (except as listed in HPI above).  Objective  Vitals:   02/17/21 1329 02/17/21 1336  BP: 140/76 132/80  Pulse:  99   Resp: 16   Temp: 97.8 F (36.6 C)   SpO2: 98%   Weight: (!) 332 lb 4.8 oz (150.7 kg)   Height: 5\' 7"  (1.702 m)     Body mass index is 52.05 kg/m.  Physical Exam  Constitutional: Patient appears well-developed and well-nourished. No distress.  HENT: Head: Normocephalic and atraumatic. Ears: B TMs ok, no erythema or effusion; Nose: Not done. Mouth/Throat: not done  Eyes: Conjunctivae and EOM are normal. Pupils are equal, round, and reactive to light. No scleral icterus.  Neck: Normal range of motion. Neck supple. No JVD present. No thyromegaly present.  Cardiovascular: Normal rate, regular rhythm and normal heart sounds.  No murmur heard. No BLE edema. Pulmonary/Chest: Effort normal and breath sounds normal. No respiratory distress. Abdominal: Soft. Bowel sounds are normal, no distension. There is no tenderness. no masses MALE GENITALIA: Normal descended testes bilaterally, no masses palpated, no hernias, no lesions, no discharge  RECTAL: not done  Musculoskeletal: Normal range of motion, no joint effusions. No gross deformities Neurological: he is alert and oriented to person, place, and time. No cranial nerve deficit. Coordination, balance, strength, speech and gait are normal.  Skin: Skin is warm and dry. No rash noted. No erythema.  Psychiatric: Patient has a normal mood and affect. behavior is normal. Judgment and thought content normal.  Fall Risk: Fall Risk  02/17/2021 04/01/2020 03/16/2019 05/22/2018 12/19/2017  Falls in the past year? 0 0 0 No No  Number falls in past yr: 0 0 0 - -  Injury with Fall? 0 0 0 - -  Follow up - - Falls evaluation  completed - -     Functional Status Survey: Is the patient deaf or have difficulty hearing?: No Does the patient have difficulty seeing, even when wearing glasses/contacts?: No Does the patient have difficulty concentrating, remembering, or making decisions?: No Does the patient have difficulty walking or climbing stairs?: No Does  the patient have difficulty dressing or bathing?: No Does the patient have difficulty doing errands alone such as visiting a doctor's office or shopping?: No    Assessment & Plan  1. Well adult exam  - CBC with Differential/Platelet - COMPLETE METABOLIC PANEL WITH GFR - Lipid panel - Hemoglobin A1c  2. Need for hepatitis C screening test  - Hepatitis C Antibody  3. Morbid obesity (HCC)  Discussed dietary options, myfitness pal, and regular exercises   -Prostate cancer screening and PSA options (with potential risks and benefits of testing vs not testing) were discussed along with recent recs/guidelines. -USPSTF grade A and B recommendations reviewed with patient; age-appropriate recommendations, preventive care, screening tests, etc discussed and encouraged; healthy living encouraged; see AVS for patient education given to patient -Discussed importance of 150 minutes of physical activity weekly, eat two servings of fish weekly, eat one serving of tree nuts ( cashews, pistachios, pecans, almonds.Marland Kitchen) every other day, eat 6 servings of fruit/vegetables daily and drink plenty of water and avoid sweet beverages.

## 2021-02-17 ENCOUNTER — Encounter: Payer: Self-pay | Admitting: Family Medicine

## 2021-02-17 ENCOUNTER — Other Ambulatory Visit: Payer: Self-pay

## 2021-02-17 ENCOUNTER — Ambulatory Visit: Payer: BC Managed Care – PPO | Admitting: Family Medicine

## 2021-02-17 VITALS — BP 132/80 | HR 99 | Temp 97.8°F | Resp 16 | Ht 67.0 in | Wt 332.3 lb

## 2021-02-17 DIAGNOSIS — Z0000 Encounter for general adult medical examination without abnormal findings: Secondary | ICD-10-CM

## 2021-02-17 DIAGNOSIS — Z1159 Encounter for screening for other viral diseases: Secondary | ICD-10-CM

## 2021-02-17 NOTE — Patient Instructions (Signed)
Myfitnesspal app Optavia diet Intermittent fasting Carbohydrate restrictive diet  Keto diet   reventive Care 75-29 Years Old, Male Preventive care refers to lifestyle choices and visits with your health care provider that can promote health and wellness. This includes:  A yearly physical exam. This is also called an annual wellness visit.  Regular dental and eye exams.  Immunizations.  Screening for certain conditions.  Healthy lifestyle choices, such as: ? Eating a healthy diet. ? Getting regular exercise. ? Not using drugs or products that contain nicotine and tobacco. ? Limiting alcohol use. What can I expect for my preventive care visit? Physical exam Your health care provider may check your:  Height and weight. These may be used to calculate your BMI (body mass index). BMI is a measurement that tells if you are at a healthy weight.  Heart rate and blood pressure.  Body temperature.  Skin for abnormal spots. Counseling Your health care provider may ask you questions about your:  Past medical problems.  Family's medical history.  Alcohol, tobacco, and drug use.  Emotional well-being.  Home life and relationship well-being.  Sexual activity.  Diet, exercise, and sleep habits.  Work and work Astronomer.  Access to firearms. What immunizations do I need? Vaccines are usually given at various ages, according to a schedule. Your health care provider will recommend vaccines for you based on your age, medical history, and lifestyle or other factors, such as travel or where you work.   What tests do I need? Blood tests  Lipid and cholesterol levels. These may be checked every 5 years starting at age 52.  Hepatitis C test.  Hepatitis B test. Screening  Diabetes screening. This is done by checking your blood sugar (glucose) after you have not eaten for a while (fasting).  Genital exam to check for testicular cancer or hernias.  STD (sexually transmitted  disease) testing, if you are at risk. Talk with your health care provider about your test results, treatment options, and if necessary, the need for more tests.   Follow these instructions at home: Eating and drinking  Eat a healthy diet that includes fresh fruits and vegetables, whole grains, lean protein, and low-fat dairy products.  Drink enough fluid to keep your urine pale yellow.  Take vitamin and mineral supplements as recommended by your health care provider.  Do not drink alcohol if your health care provider tells you not to drink.  If you drink alcohol: ? Limit how much you have to 0-2 drinks a day. ? Be aware of how much alcohol is in your drink. In the [place] one drink equals one 12 oz bottle of beer (355 mL), one 5 oz glass of wine (148 mL), or one 1 oz glass of hard liquor (44 mL).   Lifestyle  Take daily care of your teeth and gums. Brush your teeth every morning and night with fluoride toothpaste. Floss one time each day.  Stay active. Exercise for at least 30 minutes 5 or more days each week.  Do not use any products that contain nicotine or tobacco, such as cigarettes, e-cigarettes, and chewing tobacco. If you need help quitting, ask your health care provider.  Do not use drugs.  If you are sexually active, practice safe sex. Use a condom or other form of protection to prevent STIs (sexually transmitted infections).  Find healthy ways to cope with stress, such as: ? Meditation, yoga, or listening to music. ? Journaling. ? Talking to a trusted person. ? Spending  time with friends and family. Safety  Always wear your seat belt while driving or riding in a vehicle.  Do not drive: ? If you have been drinking alcohol. Do not ride with someone who has been drinking. ? When you are tired or distracted. ? While texting.  Wear a helmet and other protective equipment during sports activities.  If you have firearms in your house, make sure you follow all gun safety  procedures.  Seek help if you have been physically or sexually abused. What's next?  Go to your health care provider once a year for an annual wellness visit.  Ask your health care provider how often you should have your eyes and teeth checked.  Stay up to date on all vaccines. This information is not intended to replace advice given to you by your health care provider. Make sure you discuss any questions you have with your health care provider. Document Revised: 07/25/2019 Document Reviewed: 11/02/2018 Elsevier Patient Education  2021 [employer].

## 2021-02-18 LAB — COMPLETE METABOLIC PANEL WITHOUT GFR
AG Ratio: 1.8 (calc) (ref 1.0–2.5)
ALT: 32 U/L (ref 9–46)
AST: 19 U/L (ref 10–40)
Albumin: 4.7 g/dL (ref 3.6–5.1)
Alkaline phosphatase (APISO): 79 U/L (ref 36–130)
BUN: 14 mg/dL (ref 7–25)
CO2: 28 mmol/L (ref 20–32)
Calcium: 9.9 mg/dL (ref 8.6–10.3)
Chloride: 103 mmol/L (ref 98–110)
Creat: 0.84 mg/dL (ref 0.60–1.35)
GFR, Est African American: 138 mL/min/1.73m2 (ref >=60)
GFR, Est Non African American: 119 mL/min/1.73m2 (ref >=60)
Globulin: 2.6 g/dL (ref 1.9–3.7)
Glucose, Bld: 102 mg/dL — ABNORMAL HIGH (ref 65–99)
Potassium: 4.2 mmol/L (ref 3.5–5.3)
Sodium: 139 mmol/L (ref 135–146)
Total Bilirubin: 0.4 mg/dL (ref 0.2–1.2)
Total Protein: 7.3 g/dL (ref 6.1–8.1)

## 2021-02-18 LAB — CBC WITH DIFFERENTIAL/PLATELET
Absolute Monocytes: 634 cells/uL (ref 200–950)
Basophils Absolute: 58 cells/uL (ref 0–200)
Basophils Relative: 0.6 %
Eosinophils Absolute: 250 cells/uL (ref 15–500)
Eosinophils Relative: 2.6 %
HCT: 43.4 % (ref 38.5–50.0)
Hemoglobin: 14.2 g/dL (ref 13.2–17.1)
Lymphs Abs: 2698 cells/uL (ref 850–3900)
MCH: 25.7 pg — ABNORMAL LOW (ref 27.0–33.0)
MCHC: 32.7 g/dL (ref 32.0–36.0)
MCV: 78.6 fL — ABNORMAL LOW (ref 80.0–100.0)
MPV: 11.7 fL (ref 7.5–12.5)
Monocytes Relative: 6.6 %
Neutro Abs: 5962 cells/uL (ref 1500–7800)
Neutrophils Relative %: 62.1 %
Platelets: 232 10*3/uL (ref 140–400)
RBC: 5.52 10*6/uL (ref 4.20–5.80)
RDW: 14 % (ref 11.0–15.0)
Total Lymphocyte: 28.1 %
WBC: 9.6 10*3/uL (ref 3.8–10.8)

## 2021-02-18 LAB — HEPATITIS C ANTIBODY
Hepatitis C Ab: NONREACTIVE
SIGNAL TO CUT-OFF: 0 (ref <1.00)

## 2021-02-18 LAB — LIPID PANEL
Cholesterol: 210 mg/dL — ABNORMAL HIGH (ref <200)
HDL: 43 mg/dL (ref >=40)
LDL Cholesterol (Calc): 124 mg/dL (calc) — ABNORMAL HIGH
Non-HDL Cholesterol (Calc): 167 mg/dL (calc) — ABNORMAL HIGH (ref <130)
Total CHOL/HDL Ratio: 4.9 (calc) (ref <5.0)
Triglycerides: 298 mg/dL — ABNORMAL HIGH (ref <150)

## 2021-02-18 LAB — HEMOGLOBIN A1C
Hgb A1c MFr Bld: 5.5 % of total Hgb (ref <5.7)
Mean Plasma Glucose: 111 mg/dL
eAG (mmol/L): 6.2 mmol/L

## 2021-04-01 ENCOUNTER — Telehealth (INDEPENDENT_AMBULATORY_CARE_PROVIDER_SITE_OTHER): Payer: BC Managed Care – PPO | Admitting: Family Medicine

## 2021-04-01 ENCOUNTER — Encounter: Payer: Self-pay | Admitting: Family Medicine

## 2021-04-01 DIAGNOSIS — E785 Hyperlipidemia, unspecified: Secondary | ICD-10-CM | POA: Diagnosis not present

## 2021-04-01 DIAGNOSIS — F988 Other specified behavioral and emotional disorders with onset usually occurring in childhood and adolescence: Secondary | ICD-10-CM

## 2021-04-01 MED ORDER — AMPHETAMINE-DEXTROAMPHET ER 20 MG PO CP24: 20.0000 mg | ORAL_CAPSULE | ORAL | 0 refills | Status: DC

## 2021-04-01 NOTE — Progress Notes (Signed)
Name: Derek Khan   MRN: [id number]    DOB: Oct 14, 1991   Date:04/01/2021       Progress Note  Subjective  Chief Complaint  Weight Loss Concerns  I connected with  Derek Khan  on 04/01/21 at  2:20 PM EDT by a video enabled telemedicine application and verified that I am speaking with the correct person using two identifiers.  I discussed the limitations of evaluation and management by telemedicine and the availability of in person appointments. The patient expressed understanding and agreed to proceed with the virtual visit  Staff also discussed with the patient that there may be a patient responsible charge related to this service. Patient Location: at home  Provider Location: [facility] Additional Individuals present: alone   HPI  Morbid Obesity: he has always been obese, but worse since adulthood, his maximum weight is now at 334 lbs - he showed me the weight on his scale . One year ago his weight was 326 lbs . He has been cutting down on junk food, packing lunch, going for walks with his wife 4x5 times a week and is getting frustrated about inability to lose weight , he would like to start medications. Discussed GLP-1 agonists and Contrave. He prefers weekly injectables ( out of stock at this time) so we will resume mediation for ADD and re-access in 2 months . He denies personal history of pancreatitis or family history of MEN syndrome  Dyslipidemia: reviewed labs done 01/2021 and showed high triglycerides and also elevated LDL  ADD: he was diagnosed in childhood, stopped taking medication years ago, has difficulty staying focused during meetings. He would like to consider resuming medication   Patient Active Problem List   Diagnosis Date Noted  . Acute hypoxemic respiratory failure due to COVID-19 (HCC) 03/17/2020  . Snoring 02/13/2019  . Nocturnal enuresis 02/13/2019  . Fever blister 07/19/2017  . History of depression 01/27/2016  . Morbid obesity (HCC) 01/27/2016     History reviewed. No pertinent surgical history.  Family History  Problem Relation Age of Onset  . Depression Mother   . Obesity Mother   . Obesity Father   . Obesity Sister   . ADD / ADHD Brother   . Obesity Brother     Social History   Socioeconomic History  . Marital status: Married    Spouse name: Lesly Rubenstein  . Number of children: 0  . Years of education: Not on file  . Highest education level: Some college, no degree  Occupational History  . Occupation: Database administrator     Employer: [email or web address]    Comment: physical job  Tobacco Use  . Smoking status: Never Smoker  . Smokeless tobacco: Never Used  Vaping Use  . Vaping Use: Never used  Substance and Sexual Activity  . Alcohol use: Yes    Alcohol/week: 1.0 standard drink    Types: 1 Standard drinks or equivalent per week    Comment: social drinker  . Drug use: No  . Sexual activity: Yes    Partners: Female    Comment: wife takes opc  Other Topics Concern  . 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 Difficulty of Paying Living Expenses: Not hard at all  Food Insecurity: No Food Insecurity  . Worried About Programme researcher, broadcasting/film/video  in the Last Year: Never true  . Ran Out of Food in the Last Year: Never true  Transportation Needs: No Transportation Needs  . Lack of Transportation (Medical): No  . Lack of Transportation (Non-Medical): No  Physical Activity: Sufficiently Active  . Days of Exercise per Week: 3 days  . Minutes of Exercise per Session: 50 min  Stress: No Stress Concern Present  . Feeling of Stress : Not at all  Social Connections: Moderately Isolated  . Frequency of Communication with Friends and Family: More than three times a week  . Frequency of Social Gatherings with Friends and Family: Twice a week  . Attends Religious Services: Never  .  Active Member of Clubs or Organizations: No  . Attends Banker Meetings: Never  . Marital Status: Married  Catering manager Violence: Not At Risk  . Fear of Current or Ex-Partner: No  . Emotionally Abused: No  . Physically Abused: No  . Sexually Abused: No     Current Outpatient Medications:  .  amphetamine-dextroamphetamine (ADDERALL XR) 20 MG 24 hr capsule, Take 1 capsule (20 mg total) by mouth every morning., Disp: 30 capsule, Rfl: 0 .  valACYclovir (VALTREX) 1000 MG tablet, Take 1 tablet (1,000 mg total) by mouth 2 (two) times daily as needed. Prn outbreak, Disp: , Rfl:   No Known Allergies  I personally reviewed active problem list, medication list, allergies, family history, social history, health maintenance with the patient/caregiver today.   ROS  Ten systems reviewed and is negative except as mentioned in HPI   Objective  Virtual encounter, vitals not obtained.  Body mass index is 52.31 kg/m.  Physical Exam  Awake, alert and oriented  PHQ2/9: Depression screen [facility] 2/9 04/01/2021 02/17/2021 04/01/2020 03/16/2019 02/13/2019  Decreased Interest 0 0 0 0 1  Down, Depressed, Hopeless 0 0 0 0 0  PHQ - 2 Score 0 0 0 0 1  Altered sleeping - 0 0 0 0  Tired, decreased energy - 0 0 0 2  Change in appetite - 0 0 0 3  Feeling bad or failure about yourself  - 0 0 0 0  Trouble concentrating - 0 0 0 0  Moving slowly or fidgety/restless - 0 0 0 0  Suicidal thoughts - 0 0 0 -  PHQ-9 Score - 0 0 0 6  Difficult doing work/chores - - - Not difficult at all Somewhat difficult   PHQ-2/9 Result is negative.    Fall Risk: Fall Risk  04/01/2021 02/17/2021 04/01/2020 03/16/2019 05/22/2018  Falls in the past year? 0 0 0 0 No  Number falls in past yr: 0 0 0 0 -  Injury with Fall? 0 0 0 0 -  Follow up Falls prevention discussed - - Falls evaluation completed -     Assessment & Plan  1. Morbid obesity (HCC)  Discussed with the patient the risk posed by an increased BMI.  Discussed importance of portion control, calorie counting and at least 150 minutes of physical activity weekly. Avoid sweet beverages and drink more water. Eat at least 6 servings of fruit and vegetables daily   2. 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  Dispense: 30 capsule; Refill: 0  Explained controlled medication, cannot lose, share or misuse, discussed possible side effects  I discussed the assessment and treatment plan with  the patient. The patient was provided an opportunity to ask questions and all were answered. The patient agreed with the plan and demonstrated an understanding of the instructions.  The patient was advised to call back or seek an in-person evaluation if the symptoms worsen or if the condition fails to improve as anticipated.  I provided 25  minutes of non-face-to-face time during this encounter.

## 2021-04-12 IMAGING — CT CT ANGIO CHEST
2 of 6 series · 18 of 46 positions shown · IV contrast (APPLIED)
Comparison: None.

CLINICAL DATA: 28-year-old male with worsening shortness of breath

EXAM:
CT ANGIOGRAPHY CHEST WITH CONTRAST
TECHNIQUE: Multidetector CT imaging of the chest was performed using the
standard protocol during bolus administration of intravenous
contrast. Multiplanar CT image reconstructions and MIPs were
obtained to evaluate the vascular anatomy.
CONTRAST:  75mL OMNIPAQUE IOHEXOL 350 MG/ML SOLN

[Series 5: thins · axial · 0.65mm/px · z∈[-929,-666]mm · 16 of 289 slices shown]
[im 13/289  lung]
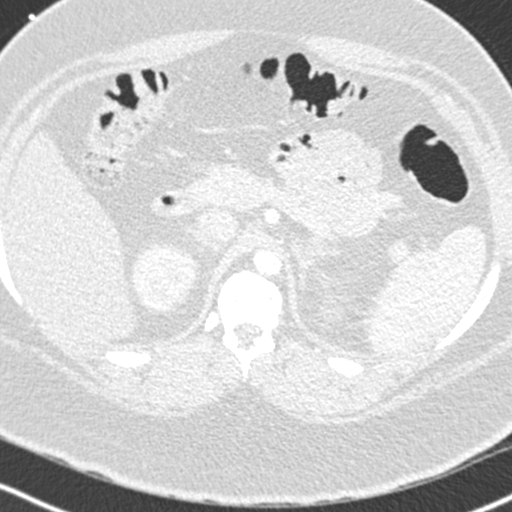
[im 38/289  soft-tissue]
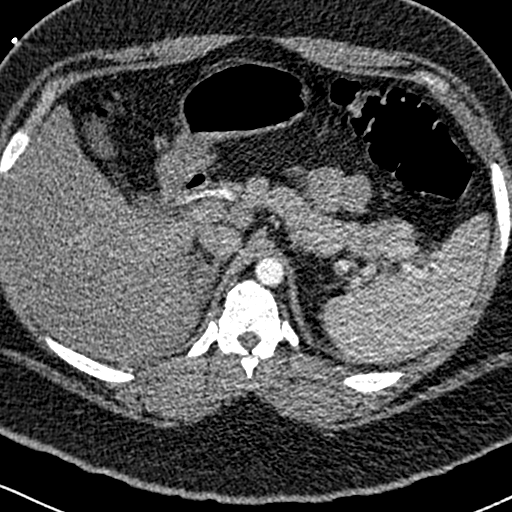
[im 51/289  lung]
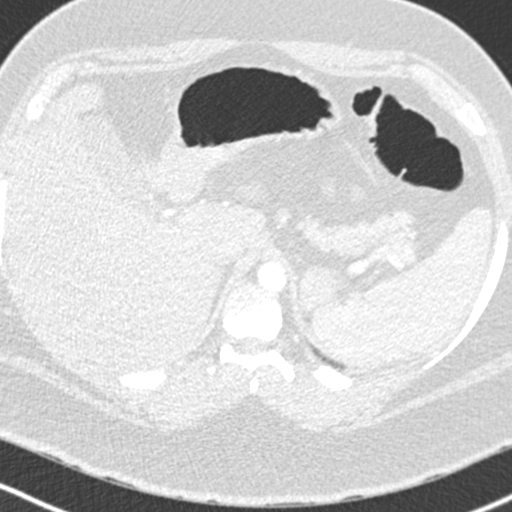
[im 63/289  soft-tissue]
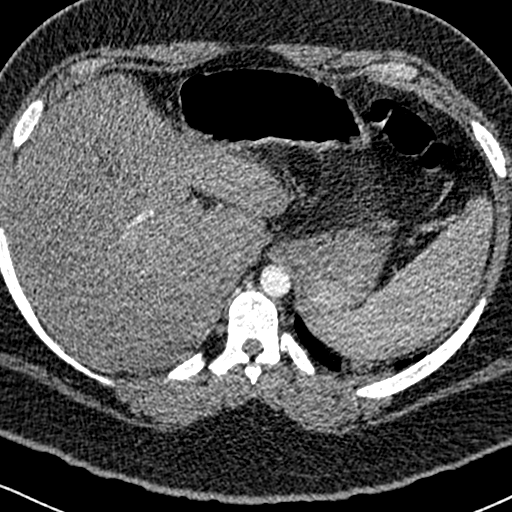
[im 88/289  lung]
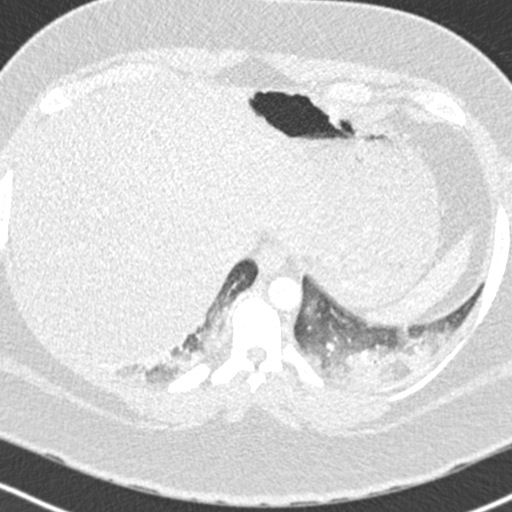
[im 101/289  soft-tissue]
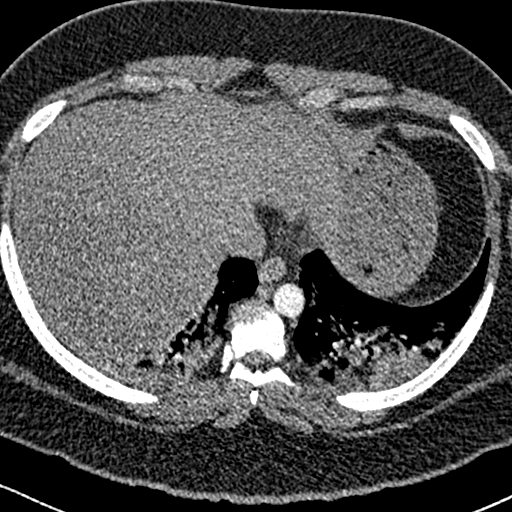
[im 113/289  lung]
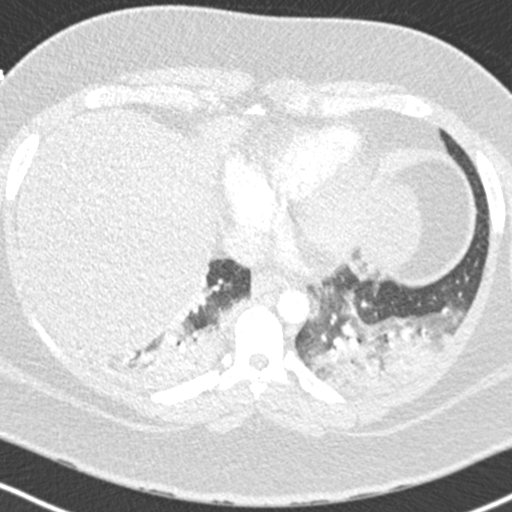
[im 138/289  soft-tissue]
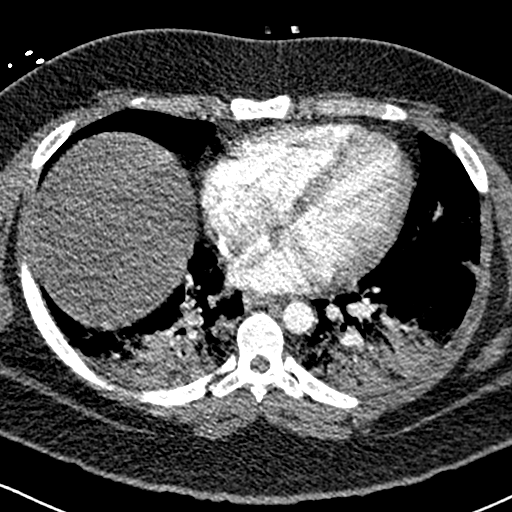
[im 151/289  lung]
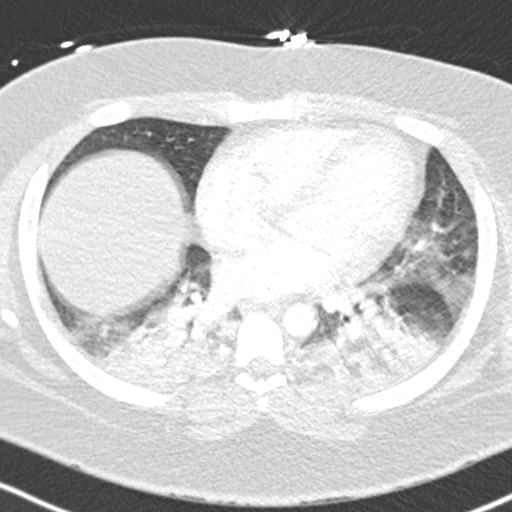
[im 176/289  soft-tissue]
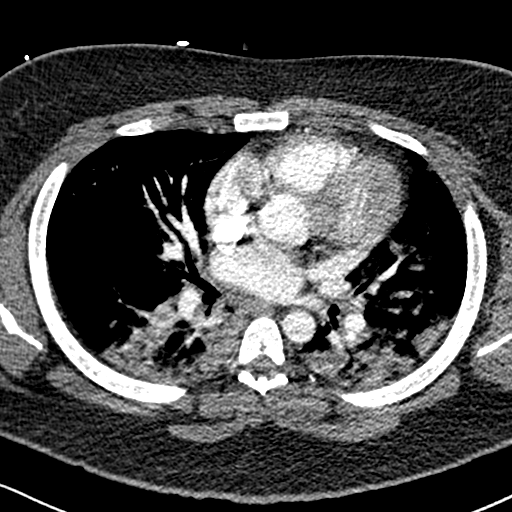
[im 188/289  lung]
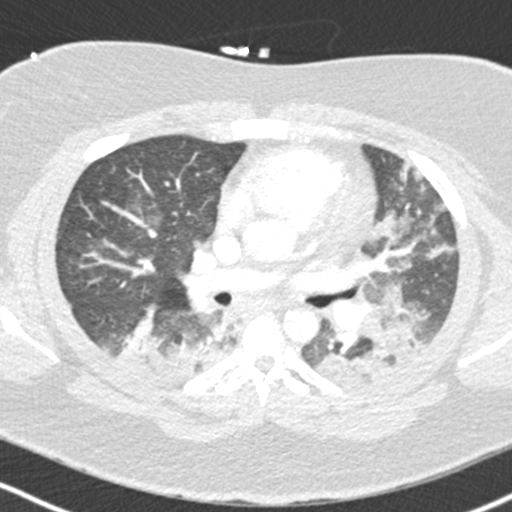
[im 201/289  soft-tissue]
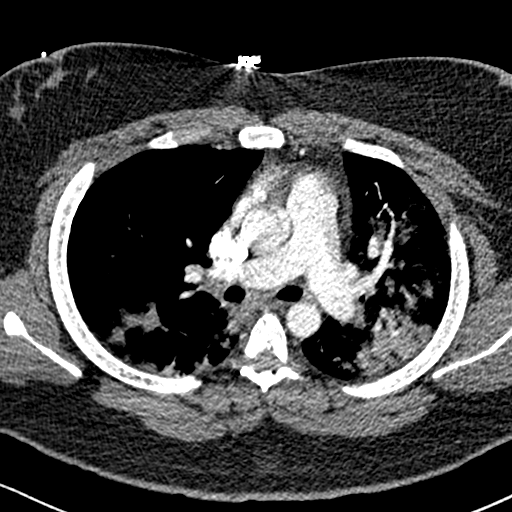
[im 226/289  lung]
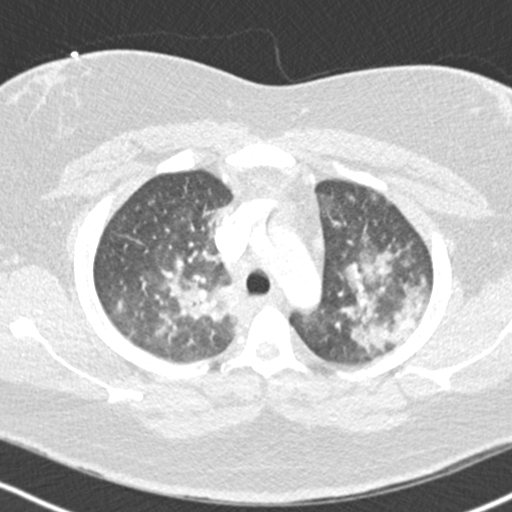
[im 238/289  soft-tissue]
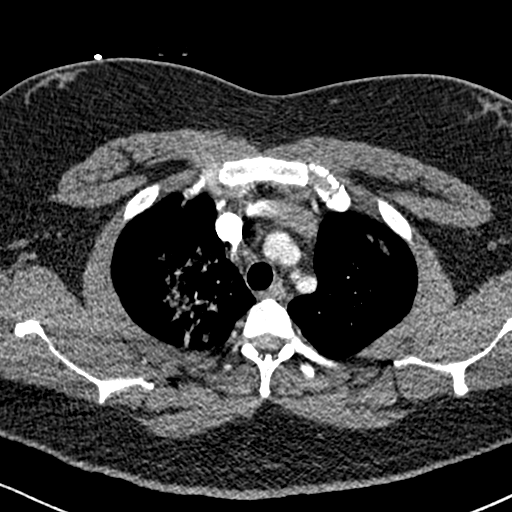
[im 251/289  lung]
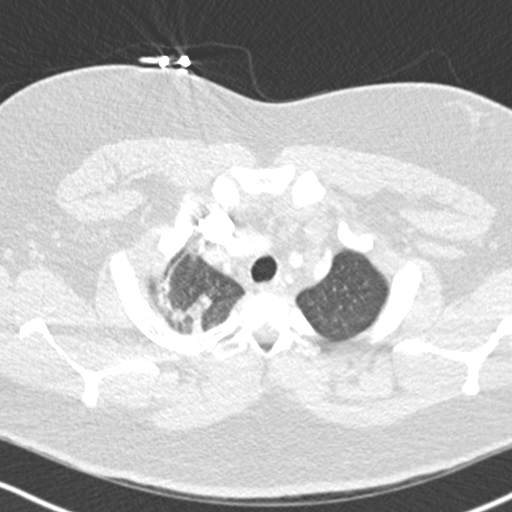
[im 276/289  soft-tissue]
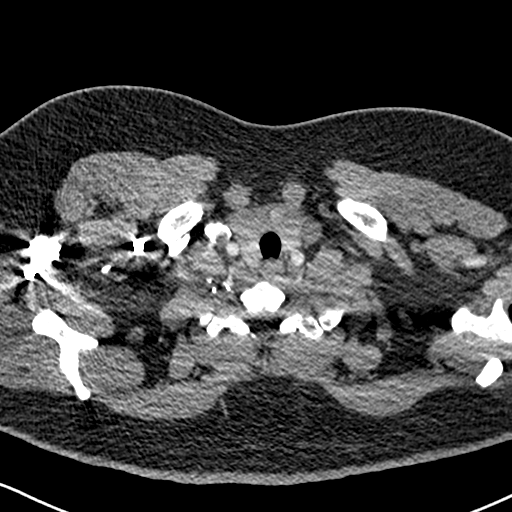

[Series 7: coronal mpr · coronal · 0.57mm/px · 2 of 106 slices shown]
[im 36/106  soft-tissue]
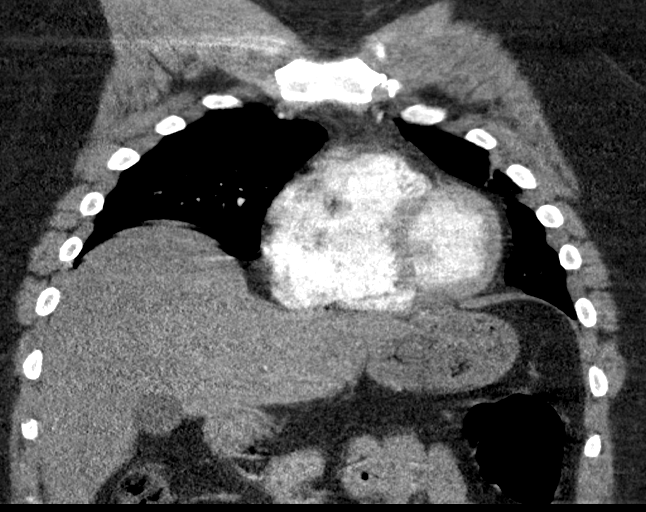
[im 71/106  soft-tissue]
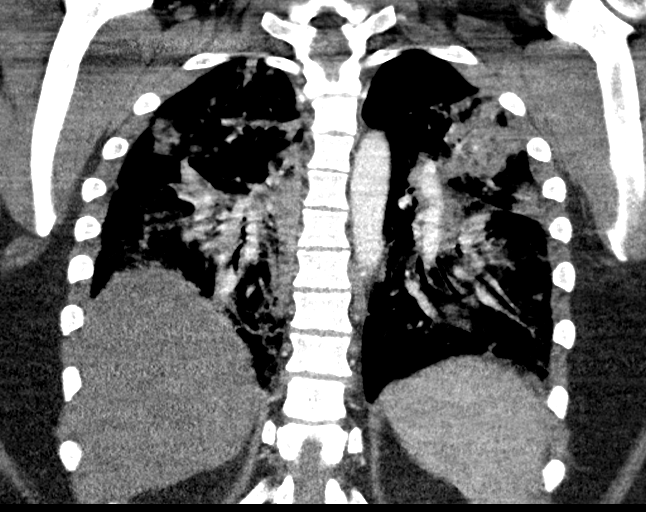

[18 of 46 positions shown; findings below may reference images not displayed]

FINDINGS: Cardiovascular:

Heart:

No cardiomegaly. No pericardial fluid/thickening. No significant
coronary calcifications.

Aorta:

Unremarkable course, caliber, contour of the thoracic aorta. No
aneurysm or dissection flap. No periaortic fluid.

Pulmonary arteries:

Timing of the contrast bolus and respiratory motion somewhat
decreases the sensitivity and specificity of the examination. There
are no filling defects within the main pulmonary artery, right or
left pulmonary arteries, or the segmental arteries. Beyond the
segmental arteries, the fidelity of the exam is decreased.

Mediastinum/Nodes: Multiple small lymph nodes of the mediastinum,
none of which are enlarged. Unremarkable appearance of the thoracic
esophagus.

Unremarkable appearance of the thoracic inlet

Lungs/Pleura: Mixed confluent nodularity with ground-glass opacities
of the bilateral upper lobes and bilateral lower lobes. To a lesser
degree the right middle lobe is involved. Atelectatic changes in the
dependent lung bases, superimposed on the background of mixed
ground-glass and nodular opacity. No pleural effusion or
pneumothorax. No interlobular septal thickening.

Upper Abdomen: No acute.

Musculoskeletal: No acute displaced fracture. Degenerative changes
of the spine.

Review of the MIP images confirms the above findings.
IMPRESSION: CT negative for evidence of pulmonary emboli to the level of the
segmental arteries. Beyond this, fidelity of the exam is decreased.

Multifocal pneumonia and lower lung atelectasis.

## 2021-05-26 ENCOUNTER — Other Ambulatory Visit: Payer: Self-pay | Admitting: Family Medicine

## 2021-05-26 DIAGNOSIS — F988 Other specified behavioral and emotional disorders with onset usually occurring in childhood and adolescence: Secondary | ICD-10-CM

## 2021-05-27 MED ORDER — AMPHETAMINE-DEXTROAMPHET ER 20 MG PO CP24: 20.0000 mg | ORAL_CAPSULE | ORAL | 0 refills | Status: DC

## 2021-06-08 NOTE — Progress Notes (Signed)
Name: Derek Khan   MRN: [id number]    DOB: March 08, 1991   Date:06/09/2021       Progress Note  Subjective  Chief Complaint  Follow Up  HPI  Morbid Obesity: he has always been obese. One year ago his weight was 326 lbs today is 333 lbs . He has been cutting down on junk food, packing lunch, going for walks with his wife 4x5 times a week but unable to lose weight. . We started him on Adderal last visit, but just started 2 weeks ago, he is willing to try Ozempic and if no response may need to add contrave Discussed possible side effects   Metabolic syndrome: he has increase in abdominal girth, elevated fasting glucose and high triglycerides, will benefit from GLP-1 agonist. No personal history of pancreatitis or family history of thyroid cancer   Dyslipidemia: reviewed labs done 01/2021 and showed high triglycerides and also elevated LDL, we will monitor for now   ADD: he was diagnosed in childhood, stopped taking medication years ago, he resumed 2 weeks ago ( he travelled and forgot to fill medication) he has noticed some improvement in focus, curbing his appetite, initially very lethargic, but back to baseline now   Patient Active Problem List   Diagnosis Date Noted   History of COVID-19 03/17/2020   Snoring 02/13/2019   Nocturnal enuresis 02/13/2019   Fever blister 07/19/2017   History of depression 01/27/2016   Morbid obesity (HCC) 01/27/2016    No past surgical history on file.  Family History  Problem Relation Age of Onset   Depression Mother    Obesity Mother    Obesity Father    Obesity Sister    ADD / ADHD Brother    Obesity Brother     Social History   Tobacco Use   Smoking status: Never   Smokeless tobacco: Never  Substance Use Topics   Alcohol use: Yes    Alcohol/week: 1.0 standard drink    Types: 1 Standard drinks or equivalent per week    Comment: social drinker     Current Outpatient Medications:    amphetamine-dextroamphetamine (ADDERALL XR)  20 MG 24 hr capsule, Take 1 capsule (20 mg total) by mouth every morning., Disp: 30 capsule, Rfl: 0   valACYclovir (VALTREX) 1000 MG tablet, Take 1 tablet (1,000 mg total) by mouth 2 (two) times daily as needed. Prn outbreak, Disp: , Rfl:   No Known Allergies  I personally reviewed active problem list, medication list, allergies, family history, social history, health maintenance with the patient/caregiver today.   ROS  Constitutional: Negative for fever or weight change.  Respiratory: Negative for cough and shortness of breath.   Cardiovascular: Negative for chest pain or palpitations.  Gastrointestinal: Negative for abdominal pain, no bowel changes.  Musculoskeletal: Negative for gait problem or joint swelling.  Skin: Negative for rash.  Neurological: Negative for dizziness or headache.  No other specific complaints in a complete review of systems (except as listed in HPI above).   Objective  Vitals:   06/09/21 1450  BP: 122/76  Pulse: 98  Resp: 16  Temp: 98 F (36.7 C)  TempSrc: Oral  SpO2: 98%  Weight: (!) 333 lb (151 kg)  Height: 5\' 7"  (1.702 m)    Body mass index is 52.16 kg/m.  Physical Exam  Constitutional: Patient appears well-developed and well-nourished. Obese  No distress.  HEENT: head atraumatic, normocephalic, pupils equal and reactive to light,  neck supple Cardiovascular: Normal rate, regular rhythm  and normal heart sounds.  No murmur heard. No BLE edema. Pulmonary/Chest: Effort normal and breath sounds normal. No respiratory distress. Abdominal: Soft.  There is no tenderness. Psychiatric: Patient has a normal mood and affect. behavior is normal. Judgment and thought content normal.   PHQ2/9: Depression screen [facility] 2/9 06/09/2021 04/01/2021 02/17/2021 04/01/2020 03/16/2019  Decreased Interest 0 0 0 0 0  Down, Depressed, Hopeless 0 0 0 0 0  PHQ - 2 Score 0 0 0 0 0  Altered sleeping - - 0 0 0  Tired, decreased energy - - 0 0 0  Change in appetite - - 0 0 0   Feeling bad or failure about yourself  - - 0 0 0  Trouble concentrating - - 0 0 0  Moving slowly or fidgety/restless - - 0 0 0  Suicidal thoughts - - 0 0 0  PHQ-9 Score - - 0 0 0  Difficult doing work/chores - - - - Not difficult at all    phq 9 is negative   Fall Risk: Fall Risk  06/09/2021 04/01/2021 02/17/2021 04/01/2020 03/16/2019  Falls in the past year? 0 0 0 0 0  Number falls in past yr: 0 0 0 0 0  Injury with Fall? 0 0 0 0 0  Follow up - Falls prevention discussed - - Falls evaluation completed      Functional Status Survey: Is the patient deaf or have difficulty hearing?: No Does the patient have difficulty seeing, even when wearing glasses/contacts?: No Does the patient have difficulty concentrating, remembering, or making decisions?: No Does the patient have difficulty walking or climbing stairs?: No Does the patient have difficulty dressing or bathing?: No Does the patient have difficulty doing errands alone such as visiting a doctor's office or shopping?: No    Assessment & Plan  1. Morbid obesity (HCC)  - Semaglutide, 1 MG/DOSE, 4 MG/3ML SOPN; Inject 1 mg as directed once a week.  Dispense: 3 mL; Refill: 0  We did not have samples, gave him Rybelsus 3 mg to start and can transition to Ozempic once done or let me know if prefers Rybelsus  7 mg  Discussed possible side effects  2. 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 Metabolic syndrome  - Semaglutide, 1 MG/DOSE, 4 MG/3ML SOPN; Inject 1 mg as directed once a week.  Dispense: 3 mL; Refill: 0

## 2021-06-09 ENCOUNTER — Other Ambulatory Visit: Payer: Self-pay

## 2021-06-09 ENCOUNTER — Ambulatory Visit (INDEPENDENT_AMBULATORY_CARE_PROVIDER_SITE_OTHER): Payer: BC Managed Care – PPO | Admitting: Family Medicine

## 2021-06-09 ENCOUNTER — Encounter: Payer: Self-pay | Admitting: Family Medicine

## 2021-06-09 DIAGNOSIS — E785 Hyperlipidemia, unspecified: Secondary | ICD-10-CM | POA: Diagnosis not present

## 2021-06-09 DIAGNOSIS — E8881 Metabolic syndrome: Secondary | ICD-10-CM

## 2021-06-09 DIAGNOSIS — F988 Other specified behavioral and emotional disorders with onset usually occurring in childhood and adolescence: Secondary | ICD-10-CM | POA: Diagnosis not present

## 2021-06-09 MED ORDER — SEMAGLUTIDE (1 MG/DOSE) 4 MG/3ML ~~LOC~~ SOPN: 1.0000 mg | PEN_INJECTOR | SUBCUTANEOUS | 0 refills | Status: DC

## 2021-06-09 NOTE — Patient Instructions (Signed)
Contrave usually costs about 100 per month

## 2021-07-10 ENCOUNTER — Other Ambulatory Visit: Payer: Self-pay | Admitting: Family Medicine

## 2021-07-10 ENCOUNTER — Encounter: Payer: Self-pay | Admitting: Family Medicine

## 2021-07-10 DIAGNOSIS — F988 Other specified behavioral and emotional disorders with onset usually occurring in childhood and adolescence: Secondary | ICD-10-CM

## 2021-07-10 MED ORDER — AMPHETAMINE-DEXTROAMPHET ER 20 MG PO CP24: 20.0000 mg | ORAL_CAPSULE | ORAL | 0 refills | Status: DC

## 2021-07-31 ENCOUNTER — Other Ambulatory Visit: Payer: Self-pay | Admitting: Family Medicine

## 2021-07-31 DIAGNOSIS — F988 Other specified behavioral and emotional disorders with onset usually occurring in childhood and adolescence: Secondary | ICD-10-CM

## 2021-07-31 MED ORDER — AMPHETAMINE-DEXTROAMPHET ER 20 MG PO CP24: 20.0000 mg | ORAL_CAPSULE | ORAL | 0 refills | Status: DC

## 2021-09-07 ENCOUNTER — Other Ambulatory Visit: Payer: Self-pay | Admitting: Family Medicine

## 2021-09-07 DIAGNOSIS — F988 Other specified behavioral and emotional disorders with onset usually occurring in childhood and adolescence: Secondary | ICD-10-CM

## 2021-09-08 MED ORDER — AMPHETAMINE-DEXTROAMPHET ER 20 MG PO CP24: 20.0000 mg | ORAL_CAPSULE | ORAL | 0 refills | Status: DC

## 2021-09-11 ENCOUNTER — Encounter: Payer: Self-pay | Admitting: Family Medicine

## 2021-09-11 ENCOUNTER — Ambulatory Visit: Payer: BC Managed Care – PPO | Admitting: Family Medicine

## 2021-09-11 ENCOUNTER — Other Ambulatory Visit: Payer: Self-pay

## 2021-09-11 DIAGNOSIS — F988 Other specified behavioral and emotional disorders with onset usually occurring in childhood and adolescence: Secondary | ICD-10-CM | POA: Diagnosis not present

## 2021-09-11 DIAGNOSIS — E785 Hyperlipidemia, unspecified: Secondary | ICD-10-CM | POA: Diagnosis not present

## 2021-09-11 DIAGNOSIS — E8881 Metabolic syndrome: Secondary | ICD-10-CM

## 2021-09-11 MED ORDER — SEMAGLUTIDE (1 MG/DOSE) 4 MG/3ML ~~LOC~~ SOPN: 1.0000 mg | PEN_INJECTOR | SUBCUTANEOUS | 0 refills | Status: DC

## 2021-09-11 MED ORDER — AMPHETAMINE-DEXTROAMPHET ER 20 MG PO CP24: 20.0000 mg | ORAL_CAPSULE | ORAL | 0 refills | Status: DC

## 2021-09-11 NOTE — Progress Notes (Signed)
Name: Derek Khan   MRN: [id number]    DOB: 1991-10-24   Date:09/11/2021       Progress Note  Subjective  Chief Complaint  Follow Up  HPI  Morbid Obesity: he has always been obese. His highest weight was 333 lbs Summer 2022 . He was already cutting down on junk food, packing lunch, going for walks with his wife 4x5 times a week when he came in for his visit but was not losing weight. We gave him a rx of Ozempic but he never filled it, he has been taking Adderal consistently for his ADD since last visit and lost 15 lbs . He states his wife is also taking Ozempic for weight loss and they have been cooking more often at home. He is willing to fill rx now   Metabolic syndrome: he has increase in abdominal girth, elevated fasting glucose and high triglycerides, will benefit from GLP-1 agonist. No personal history of pancreatitis or family history of thyroid cancer . Rx sent last visit but never filled, gave him a sample today and rx for 1 mg to be picked up before his next visit   Dyslipidemia: reviewed labs done 01/2021 and showed high triglycerides and also elevated LDL, recheck next visit   ADD: he was diagnosed in childhood, stopped taking medication years ago, but resumed Summer 2022 , he has noticed some improvement in focus, curbing his appetite, initially very lethargic, but feel like his normal self now but able to focus better   Patient Active Problem List   Diagnosis Date Noted   History of COVID-19 03/17/2020   Snoring 02/13/2019   Nocturnal enuresis 02/13/2019   Fever blister 07/19/2017   History of depression 01/27/2016   Morbid obesity (HCC) 01/27/2016    No past surgical history on file.  Family History  Problem Relation Age of Onset   Depression Mother    Obesity Mother    Obesity Father    Obesity Sister    ADD / ADHD Brother    Obesity Brother     Social History   Tobacco Use   Smoking status: Never   Smokeless tobacco: Never  Substance Use Topics    Alcohol use: Yes    Alcohol/week: 1.0 standard drink    Types: 1 Standard drinks or equivalent per week    Comment: social drinker     Current Outpatient Medications:    amphetamine-dextroamphetamine (ADDERALL XR) 20 MG 24 hr capsule, Take 1 capsule (20 mg total) by mouth every morning., Disp: 30 capsule, Rfl: 0   valACYclovir (VALTREX) 1000 MG tablet, Take 1 tablet (1,000 mg total) by mouth 2 (two) times daily as needed. Prn outbreak, Disp: , Rfl:    Semaglutide, 1 MG/DOSE, 4 MG/3ML SOPN, Inject 1 mg as directed once a week. (Patient not taking: Reported on 09/11/2021), Disp: 3 mL, Rfl: 0  No Known Allergies  I personally reviewed active problem list, medication list, allergies, family history, social history, health maintenance with the patient/caregiver today.   ROS  Constitutional: Negative for fever , positive for weight change.  Respiratory: Negative for cough and shortness of breath.   Cardiovascular: Negative for chest pain or palpitations.  Gastrointestinal: Negative for abdominal pain, no bowel changes.  Musculoskeletal: Negative for gait problem or joint swelling.  Skin: Negative for rash.  Neurological: Negative for dizziness or headache.  No other specific complaints in a complete review of systems (except as listed in HPI above).   Objective  Vitals:  09/11/21 0947  BP: 112/74  Pulse: 95  Resp: 18  Temp: 97.6 F (36.4 C)  TempSrc: Oral  SpO2: 99%  Weight: (!) 318 lb 12.8 oz (144.6 kg)  Height: 5\' 7"  (1.702 m)    Body mass index is 49.93 kg/m.  Physical Exam  Constitutional: Patient appears well-developed and well-nourished. Obese  No distress.  HEENT: head atraumatic, normocephalic, pupils equal and reactive to light, neck supple Cardiovascular: Normal rate, regular rhythm and normal heart sounds.  No murmur heard. No BLE edema. Pulmonary/Chest: Effort normal and breath sounds normal. No respiratory distress. Abdominal: Soft.  There is no  tenderness. Psychiatric: Patient has a normal mood and affect. behavior is normal. Judgment and thought content normal.   PHQ2/9: Depression screen [facility] 2/9 09/11/2021 06/09/2021 04/01/2021 02/17/2021 04/01/2020  Decreased Interest 0 0 0 0 0  Down, Depressed, Hopeless 0 0 0 0 0  PHQ - 2 Score 0 0 0 0 0  Altered sleeping 0 - - 0 0  Tired, decreased energy 0 - - 0 0  Change in appetite 0 - - 0 0  Feeling bad or failure about yourself  0 - - 0 0  Trouble concentrating 0 - - 0 0  Moving slowly or fidgety/restless 0 - - 0 0  Suicidal thoughts 0 - - 0 0  PHQ-9 Score 0 - - 0 0  Difficult doing work/chores Not difficult at all - - - -    phq 9 is negative   Fall Risk: Fall Risk  09/11/2021 06/09/2021 04/01/2021 02/17/2021 04/01/2020  Falls in the past year? 0 0 0 0 0  Number falls in past yr: 0 0 0 0 0  Injury with Fall? 0 0 0 0 0  Risk for fall due to : No Fall Risks - - - -  Follow up Falls prevention discussed - Falls prevention discussed - -     Functional Status Survey: Is the patient deaf or have difficulty hearing?: No Does the patient have difficulty seeing, even when wearing glasses/contacts?: No Does the patient have difficulty concentrating, remembering, or making decisions?: No Does the patient have difficulty walking or climbing stairs?: No Does the patient have difficulty dressing or bathing?: No Does the patient have difficulty doing errands alone such as visiting a doctor's office or shopping?: No    Assessment & Plan  1. Attention deficit disorder (ADD) without hyperactivity  - amphetamine-dextroamphetamine (ADDERALL XR) 20 MG 24 hr capsule; Take 1 capsule (20 mg total) by mouth every morning.  Dispense: 30 capsule; Refill: 0 - amphetamine-dextroamphetamine (ADDERALL XR) 20 MG 24 hr capsule; Take 1 capsule (20 mg total) by mouth every morning.  Dispense: 30 capsule; Refill: 0 - amphetamine-dextroamphetamine (ADDERALL XR) 20 MG 24 hr capsule; Take 1 capsule (20 mg total)  by mouth every morning.  Dispense: 30 capsule; Refill: 0  2. Morbid obesity (HCC)  - Semaglutide, 1 MG/DOSE, 4 MG/3ML SOPN; Inject 1 mg as directed once a week.  Dispense: 9 mL; Refill: 0  3. Dyslipidemia  On life style modification  4. Metabolic syndrome  - Semaglutide, 1 MG/DOSE, 4 MG/3ML SOPN; Inject 1 mg as directed once a week.  Dispense: 9 mL; Refill: 0

## 2021-10-18 ENCOUNTER — Ambulatory Visit
Admission: EM | Admit: 2021-10-18 | Discharge: 2021-10-18 | Disposition: A | Discharge status: Home / Self Care | Payer: BC Managed Care – PPO | Attending: Physician Assistant | Admitting: Physician Assistant

## 2021-10-18 ENCOUNTER — Other Ambulatory Visit: Payer: Self-pay

## 2021-10-18 ENCOUNTER — Encounter: Payer: Self-pay | Admitting: Emergency Medicine

## 2021-10-18 DIAGNOSIS — L0100 Impetigo, unspecified: Secondary | ICD-10-CM | POA: Diagnosis not present

## 2021-10-18 LAB — GROUP A STREP BY PCR: Group A Strep by PCR: NOT DETECTED

## 2021-10-18 MED ORDER — BENZONATATE 100 MG PO CAPS
100.0000 mg | ORAL_CAPSULE | Freq: Three times a day (TID) | ORAL | 0 refills | Status: DC
Start: 2021-10-18 — End: 2021-12-14

## 2021-10-18 MED ORDER — MUPIROCIN CALCIUM 2 % EX CREA
1.0000 | TOPICAL_CREAM | Freq: Two times a day (BID) | CUTANEOUS | 0 refills | Status: AC
Start: 2021-10-18 — End: 2021-11-01

## 2021-10-18 NOTE — ED Triage Notes (Signed)
Patient c/o sore throat that started on Wed.  Patient also reports using nasal spray and having nosebleeds since using the nasal spray.  Patient denies fevers.

## 2021-10-18 NOTE — Discharge Instructions (Addendum)
Use mupirocin ointment as directed. Follow up with PCP.

## 2021-10-18 NOTE — ED Provider Notes (Signed)
[facility]    CSN: [id number] Arrival date & time: 10/18/21  1143      History   Chief Complaint Chief Complaint  Patient presents with   Sore Throat    HPI Derek Khan is a 30 y.o. male.   30 year old male patient, Derek Falwell, presents to urgent care with chief complaint of sore throat for 4 days, also reports using nasal spray and having nosebleeds.  Patient states he uses Valtrex for fever blisters currently has 1 on left upper lip.  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 No language interpreter was used.   Past Medical History:  Diagnosis Date   Depression    Herpes simplex     Patient Active Problem List   Diagnosis Date Noted   Impetigo 10/18/2021   History of COVID-19 03/17/2020   Snoring 02/13/2019   Nocturnal enuresis 02/13/2019   Fever blister 07/19/2017   History of depression 01/27/2016   Morbid obesity (HCC) 01/27/2016    History reviewed. No pertinent surgical history.     Home Medications    Prior to Admission medications   Medication Sig Start Date End Date Taking? Authorizing Provider  benzonatate (TESSALON) 100 MG capsule Take 1 capsule (100 mg total) by mouth every 8 (eight) hours. 10/18/21  Yes Makeya Hilgert, Para March, NP  mupirocin cream (BACTROBAN) 2 % Apply 1 application topically 2 (two) times daily for 14 days. 10/18/21 11/01/21 Yes Kataleyah Carducci, Para March, NP  Semaglutide, 1 MG/DOSE, 4 MG/3ML SOPN Inject 1 mg as directed once a week. 09/11/21  Yes Sowles, Danna Hefty, MD  amphetamine-dextroamphetamine (ADDERALL XR) 20 MG 24 hr capsule Take 1 capsule (20 mg total) by mouth every morning. 09/11/21   Alba Cory, MD  amphetamine-dextroamphetamine (ADDERALL XR) 20 MG 24 hr capsule Take 1 capsule (20 mg total) by mouth every morning. 09/11/21   Alba Cory, MD  amphetamine-dextroamphetamine (ADDERALL XR) 20 MG 24 hr capsule Take 1 capsule (20 mg  total) by mouth every morning. 09/11/21   Alba Cory, MD  valACYclovir (VALTREX) 1000 MG tablet Take 1 tablet (1,000 mg total) by mouth 2 (two) times daily as needed. Prn outbreak 03/20/20   Darlin Priestly, MD    Family History Family History  Problem Relation Age of Onset   Depression Mother    Obesity Mother    Obesity Father    Obesity Sister    ADD / ADHD Brother    Obesity Brother     Social History Social History   Tobacco Use   Smoking status: Never   Smokeless tobacco: Never  Vaping Use   Vaping Use: Never used  Substance Use Topics   Alcohol use: Yes    Alcohol/week: 1.0 standard drink    Types: 1 Standard drinks or equivalent per week    Comment: social drinker   Drug use: No     Allergies   Patient has no known allergies.   Review of Systems Review of Systems  HENT:  Positive for congestion, mouth sores and nosebleeds.   All other systems reviewed and are negative.   Physical Exam Triage Vital Signs ED Triage Vitals  Enc Vitals Group     BP 10/18/21 1224 135/89     Pulse Rate 10/18/21 1224 91     Resp 10/18/21 1224 16     Temp 10/18/21 1224 98.4 F (36.9 C)     Temp Source 10/18/21 1224 Oral  SpO2 10/18/21 1224 98 %     Weight 10/18/21 1222 (!) 315 lb (142.9 kg)     Height 10/18/21 1222 5\' 7"  (1.702 m)     Head Circumference --      Peak Flow --      Pain Score 10/18/21 1221 7     Pain Loc --      Pain Edu? --      Excl. in [place]? --    No data found.  Updated Vital Signs BP 135/89 (BP Location: Left Arm)   Pulse 91   Temp 98.4 F (36.9 C) (Oral)   Resp 16   Ht 5\' 7"  (1.702 m)   Wt (!) 315 lb (142.9 kg)   SpO2 98%   BMI 49.34 kg/m   Visual Acuity Right Eye Distance:   Left Eye Distance:   Bilateral Distance:    Right Eye Near:   Left Eye Near:    Bilateral Near:     Physical Exam Vitals and nursing note reviewed.  Constitutional:      General: He is not in acute distress.    Appearance: He is well-developed. He is  obese. He is not ill-appearing or toxic-appearing.  HENT:     Head: Normocephalic.     Right Ear: Tympanic membrane is retracted.     Left Ear: Tympanic membrane is retracted.     Nose: Mucosal edema and congestion present.     Right Nostril: Epistaxis present.     Right Turbinates: Enlarged.      Mouth/Throat:     Lips: Pink.     Mouth: Mucous membranes are moist.     Pharynx: Oropharynx is clear. Uvula midline.  Eyes:     General: Lids are normal.     Conjunctiva/sclera: Conjunctivae normal.     Pupils: Pupils are equal, round, and reactive to light.  Neck:     Trachea: Trachea normal.  Cardiovascular:     Rate and Rhythm: Normal rate and regular rhythm.     Pulses: Normal pulses.     Heart sounds: Normal heart sounds.  Pulmonary:     Effort: Pulmonary effort is normal. No respiratory distress.     Breath sounds: Normal breath sounds and air entry. No decreased breath sounds or wheezing.  Abdominal:     General: There is no distension.     Palpations: Abdomen is soft.  Musculoskeletal:        General: Normal range of motion.     Cervical back: Normal range of motion.  Skin:    General: Skin is warm and dry.     Findings: No rash.  Neurological:     General: No focal deficit present.     Mental Status: He is alert and oriented to person, place, and time.     GCS: GCS eye subscore is 4. GCS verbal subscore is 5. GCS motor subscore is 6.     Cranial Nerves: No cranial nerve deficit.     Sensory: No sensory deficit.  Psychiatric:        Attention and Perception: Attention normal.        Mood and Affect: Mood normal.        Speech: Speech normal.        Behavior: Behavior normal. Behavior is cooperative.     UC Treatments / Results  Labs (all labs ordered are listed, but only abnormal results are displayed) Labs Reviewed  GROUP A STREP BY PCR    EKG  Radiology No results found.  Procedures Procedures (including critical care time)  Medications Ordered in  UC Medications - No data to display  Initial Impression / Assessment and Plan / UC Course  I have reviewed the triage vital signs and the nursing notes.  Pertinent labs & imaging results that were available during my care of the patient were reviewed by me and considered in my medical decision making (see chart for details).     Ddx: Impetigo, herpes, nasal abrasion, Viral illness Final Clinical Impressions(s) / UC Diagnoses   Final diagnoses:  Impetigo     Discharge Instructions      Use mupirocin ointment as directed. Follow up with PCP.    ED Prescriptions     Medication Sig Dispense Auth. Provider   mupirocin cream (BACTROBAN) 2 % Apply 1 application topically 2 (two) times daily for 14 days. 28 g Maleka Contino, NP   benzonatate (TESSALON) 100 MG capsule Take 1 capsule (100 mg total) by mouth every 8 (eight) hours. 21 capsule Iyannah Blake, Jeanett Schlein, NP      PDMP not reviewed this encounter.   Tori Milks, NP [phone]

## 2021-12-04 ENCOUNTER — Other Ambulatory Visit: Payer: Self-pay | Admitting: Family Medicine

## 2021-12-04 DIAGNOSIS — E8881 Metabolic syndrome: Secondary | ICD-10-CM

## 2021-12-11 NOTE — Progress Notes (Signed)
Name: Derek Khan   MRN: [id number]    DOB: March 20, 1991   Date:12/14/2021       Progress Note  Subjective  Chief Complaint  Follow Up  HPI  Morbid Obesity: he has always been obese. His highest weight was 333 lbs Summer 2022 . He was already cutting down on junk food, packing lunch, going for walks with his wife 4x5 times a week , he had lost weight with Adderal from 07/22 until 10/22 he was down to 319 lbs, he asked for additional help and we gave him Ozempic, however he stopped taking Adderal and weight is only down to 2 lbs since Fall 22. Explained Adderal is for his ADD but also helps with weight loss and he needs to resume medication.   Metabolic syndrome: he has increase in abdominal girth, elevated fasting glucose and high triglycerides, will benefit from GLP-1 agonist. No personal history of pancreatitis or family history of thyroid cancer . He has been taking Ozempic and medication curbs his appetite.   Dyslipidemia: reviewed labs done 01/2021 and showed high triglycerides and also elevated LDL, we will recheck labs during his CPE in March    ADD: he was diagnosed in childhood, stopped taking medication years ago, but resumed Summer 2022 , medication helps with focus, but he stopped taking it when he started taking Ozempic. We will resume Adderal for focus   Patient Active Problem List   Diagnosis Date Noted   Impetigo 10/18/2021   History of COVID-19 03/17/2020   Snoring 02/13/2019   Nocturnal enuresis 02/13/2019   Fever blister 07/19/2017   History of depression 01/27/2016   Morbid obesity ([place]) 01/27/2016    No past surgical history on file.  Family History  Problem Relation Age of Onset   Depression Mother    Obesity Mother    Obesity Father    Obesity Sister    ADD / ADHD Brother    Obesity Brother     Social History   Tobacco Use   Smoking status: Never   Smokeless tobacco: Never  Substance Use Topics   Alcohol use: Yes    Alcohol/week: 1.0  standard drink    Types: 1 Standard drinks or equivalent per week    Comment: social drinker     Current Outpatient Medications:    amphetamine-dextroamphetamine (ADDERALL XR) 20 MG 24 hr capsule, Take 1 capsule (20 mg total) by mouth every morning., Disp: 30 capsule, Rfl: 0   amphetamine-dextroamphetamine (ADDERALL XR) 20 MG 24 hr capsule, Take 1 capsule (20 mg total) by mouth every morning., Disp: 30 capsule, Rfl: 0   amphetamine-dextroamphetamine (ADDERALL XR) 20 MG 24 hr capsule, Take 1 capsule (20 mg total) by mouth every morning., Disp: 30 capsule, Rfl: 0   Semaglutide, 1 MG/DOSE, 4 MG/3ML SOPN, Inject 1 mg as directed once a week., Disp: 9 mL, Rfl: 0   valACYclovir (VALTREX) 1000 MG tablet, Take 1 tablet (1,000 mg total) by mouth 2 (two) times daily as needed. Prn outbreak, Disp: , Rfl:   No Known Allergies  I personally reviewed active problem list, medication list, allergies, family history, social history, health maintenance with the patient/caregiver today.   ROS  Constitutional: Negative for fever or weight change.  Respiratory: Negative for cough and shortness of breath.   Cardiovascular: Negative for chest pain or palpitations.  Gastrointestinal: Negative for abdominal pain, no bowel changes.  Musculoskeletal: Negative for gait problem or joint swelling.  Skin: Negative for rash.  Neurological: Negative for dizziness  or headache.  No other specific complaints in a complete review of systems (except as listed in HPI above).   Objective  Vitals:   12/14/21 1104  BP: 118/70  Pulse: 92  Resp: 16  Temp: 98.1 F (36.7 C)  SpO2: 98%  Weight: (!) 317 lb (143.8 kg)  Height: 5\' 7"  (1.702 m)    Body mass index is 49.65 kg/m.  Physical Exam  Constitutional: Patient appears well-developed and well-nourished. Obese  No distress.  HEENT: head atraumatic, normocephalic, pupils equal and reactive to light, neck supple Cardiovascular: Normal rate, regular rhythm and  normal heart sounds.  No murmur heard. No BLE edema. Pulmonary/Chest: Effort normal and breath sounds normal. No respiratory distress. Abdominal: Soft.  There is no tenderness. Psychiatric: Patient has a normal mood and affect. behavior is normal. Judgment and thought content normal.   Recent Results (from the past 2160 hour(s))  Group A Strep by PCR     Status: None   Collection Time: 10/18/21 12:26 PM   Specimen: Throat; Sterile Swab  Result Value Ref Range   Group A Strep by PCR NOT DETECTED NOT DETECTED    Comment: Performed at [facility], [address] [place], [place] [address]     PHQ2/9: Depression screen [facility] 2/9 12/14/2021 09/11/2021 06/09/2021 04/01/2021 02/17/2021  Decreased Interest 0 0 0 0 0  Down, Depressed, Hopeless 0 0 0 0 0  PHQ - 2 Score 0 0 0 0 0  Altered sleeping 0 0 - - 0  Tired, decreased energy 0 0 - - 0  Change in appetite 0 0 - - 0  Feeling bad or failure about yourself  0 0 - - 0  Trouble concentrating 0 0 - - 0  Moving slowly or fidgety/restless 0 0 - - 0  Suicidal thoughts 0 0 - - 0  PHQ-9 Score 0 0 - - 0  Difficult doing work/chores - Not difficult at all - - -    phq 9 is negative   Fall Risk: Fall Risk  12/14/2021 09/11/2021 06/09/2021 04/01/2021 02/17/2021  Falls in the past year? 0 0 0 0 0  Number falls in past yr: 0 0 0 0 0  Injury with Fall? 0 0 0 0 0  Risk for fall due to : No Fall Risks No Fall Risks - - -  Follow up Falls prevention discussed Falls prevention discussed - Falls prevention discussed -      Functional Status Survey: Is the patient deaf or have difficulty hearing?: No Does the patient have difficulty seeing, even when wearing glasses/contacts?: No Does the patient have difficulty concentrating, remembering, or making decisions?: No Does the patient have difficulty walking or climbing stairs?: No Does the patient have difficulty dressing or bathing?: No Does the patient have difficulty doing errands alone  such as visiting a doctor's office or shopping?: No    Assessment & Plan  1. Attention deficit disorder (ADD) without hyperactivity  Resume Adderal , we called pharmacy and he still has 3 rx on hold   2. Morbid obesity ([facility])  - Semaglutide, 1 MG/DOSE, 4 MG/3ML SOPN; Inject 1 mg as directed once a week.  Dispense: 9 mL; Refill: 0  3. Dyslipidemia   4. Metabolic syndrome  - Semaglutide, 1 MG/DOSE, 4 MG/3ML SOPN; Inject 1 mg as directed once a week.  Dispense: 9 mL; Refill: 0  5. Long-term use of high-risk medication  .

## 2021-12-14 ENCOUNTER — Encounter: Payer: Self-pay | Admitting: Family Medicine

## 2021-12-14 ENCOUNTER — Ambulatory Visit: Payer: BC Managed Care – PPO | Admitting: Family Medicine

## 2021-12-14 VITALS — BP 118/70 | HR 92 | Temp 98.1°F | Resp 16 | Ht 67.0 in | Wt 317.0 lb

## 2021-12-14 DIAGNOSIS — E785 Hyperlipidemia, unspecified: Secondary | ICD-10-CM

## 2021-12-14 DIAGNOSIS — F988 Other specified behavioral and emotional disorders with onset usually occurring in childhood and adolescence: Secondary | ICD-10-CM

## 2021-12-14 DIAGNOSIS — E8881 Metabolic syndrome: Secondary | ICD-10-CM | POA: Diagnosis not present

## 2021-12-14 DIAGNOSIS — Z79899 Other long term (current) drug therapy: Secondary | ICD-10-CM

## 2021-12-14 MED ORDER — SEMAGLUTIDE (1 MG/DOSE) 4 MG/3ML ~~LOC~~ SOPN: 1.0000 mg | PEN_INJECTOR | SUBCUTANEOUS | 0 refills | Status: DC

## 2022-01-06 ENCOUNTER — Other Ambulatory Visit: Payer: Self-pay | Admitting: Family Medicine

## 2022-01-06 DIAGNOSIS — F988 Other specified behavioral and emotional disorders with onset usually occurring in childhood and adolescence: Secondary | ICD-10-CM

## 2022-01-07 ENCOUNTER — Other Ambulatory Visit: Payer: Self-pay

## 2022-01-07 ENCOUNTER — Encounter: Payer: Self-pay | Admitting: Family Medicine

## 2022-01-07 DIAGNOSIS — F988 Other specified behavioral and emotional disorders with onset usually occurring in childhood and adolescence: Secondary | ICD-10-CM

## 2022-01-07 MED ORDER — AMPHETAMINE-DEXTROAMPHET ER 20 MG PO CP24: 20.0000 mg | ORAL_CAPSULE | ORAL | 0 refills | Status: DC

## 2022-02-06 ENCOUNTER — Other Ambulatory Visit: Payer: Self-pay | Admitting: Family Medicine

## 2022-02-06 DIAGNOSIS — F988 Other specified behavioral and emotional disorders with onset usually occurring in childhood and adolescence: Secondary | ICD-10-CM

## 2022-02-08 ENCOUNTER — Other Ambulatory Visit: Payer: Self-pay

## 2022-02-08 MED ORDER — AMPHETAMINE-DEXTROAMPHET ER 20 MG PO CP24
20.0000 mg | ORAL_CAPSULE | ORAL | 0 refills | Status: DC
  Filled 2022-02-08: qty 30, 30d supply, fill #0

## 2022-02-18 NOTE — Patient Instructions (Signed)
Preventive Care 21-30 Years Old, Male ?Preventive care refers to lifestyle choices and visits with your health care provider that can promote health and wellness. Preventive care visits are also called wellness exams. ?What can I expect for my preventive care visit? ?Counseling ?During your preventive care visit, your health care provider may ask about your: ?Medical history, including: ?Past medical problems. ?Family medical history. ?Current health, including: ?Emotional well-being. ?Home life and relationship well-being. ?Sexual activity. ?Lifestyle, including: ?Alcohol, nicotine or tobacco, and drug use. ?Access to firearms. ?Diet, exercise, and sleep habits. ?Safety issues such as seatbelt and bike helmet use. ?Sunscreen use. ?Work and work environment. ?Physical exam ?Your health care provider may check your: ?Height and weight. These may be used to calculate your BMI (body mass index). BMI is a measurement that tells if you are at a healthy weight. ?Waist circumference. This measures the distance around your waistline. This measurement also tells if you are at a healthy weight and may help predict your risk of certain diseases, such as type 2 diabetes and high blood pressure. ?Heart rate and blood pressure. ?Body temperature. ?Skin for abnormal spots. ?What immunizations do I need? ?Vaccines are usually given at various ages, according to a schedule. Your health care provider will recommend vaccines for you based on your age, medical history, and lifestyle or other factors, such as travel or where you work. ?What tests do I need? ?Screening ?Your health care provider may recommend screening tests for certain conditions. This may include: ?Lipid and cholesterol levels. ?Diabetes screening. This is done by checking your blood sugar (glucose) after you have not eaten for a while (fasting). ?Hepatitis B test. ?Hepatitis C test. ?HIV (human immunodeficiency virus) test. ?STI (sexually transmitted infection)  testing, if you are at risk. ?Talk with your health care provider about your test results, treatment options, and if necessary, the need for more tests. ?Follow these instructions at home: ?Eating and drinking ? ?Eat a healthy diet that includes fresh fruits and vegetables, whole grains, lean protein, and low-fat dairy products. ?Drink enough fluid to keep your urine pale yellow. ?Take vitamin and mineral supplements as recommended by your health care provider. ?Do not drink alcohol if your health care provider tells you not to drink. ?If you drink alcohol: ?Limit how much you have to 0-2 drinks a day. ?Know how much alcohol is in your drink. In the [place] one drink equals one 12 oz bottle of beer (355 mL), one 5 oz glass of wine (148 mL), or one 1? oz glass of hard liquor (44 mL). ?Lifestyle ?Brush your teeth every morning and night with fluoride toothpaste. Floss one time each day. ?Exercise for at least 30 minutes 5 or more days each week. ?Do not use any products that contain nicotine or tobacco. These products include cigarettes, chewing tobacco, and vaping devices, such as e-cigarettes. If you need help quitting, ask your health care provider. ?Do not use drugs. ?If you are sexually active, practice safe sex. Use a condom or other form of protection to prevent STIs. ?Find healthy ways to manage stress, such as: ?Meditation, yoga, or listening to music. ?Journaling. ?Talking to a trusted person. ?Spending time with friends and family. ?Minimize exposure to UV radiation to reduce your risk of skin cancer. ?Safety ?Always wear your seat belt while driving or riding in a vehicle. ?Do not drive: ?If you have been drinking alcohol. Do not ride with someone who has been drinking. ?If you have been using any mind-altering substances or   drugs. ?While texting. ?When you are tired or distracted. ?Wear a helmet and other protective equipment during sports activities. ?If you have firearms in your house, make sure you  follow all gun safety procedures. ?Seek help if you have been physically or sexually abused. ?What's next? ?Go to your health care provider once a year for an annual wellness visit. ?Ask your health care provider how often you should have your eyes and teeth checked. ?Stay up to date on all vaccines. ?This information is not intended to replace advice given to you by your health care provider. Make sure you discuss any questions you have with your health care provider. ?Document Revised: 05/06/2021 Document Reviewed: 05/06/2021 ?Elsevier Patient Education ? 2022 Elsevier Inc. ? ?

## 2022-02-18 NOTE — Progress Notes (Signed)
Name: Derek Khan   MRN: [id number]    DOB: 1991-02-13   Date:02/19/2022 ? ?     Progress Note ? ?Subjective ? ?Chief Complaint ? ?Annual Exam ? ?HPI ? ?Patient presents for annual CPE . ? ? ?Diet: eating more at home ?Exercise: discussed some strength training  ? ?Depression: phq 9 is negative ? ?  02/19/2022  ?  8:06 AM 12/14/2021  ? 11:03 AM 09/11/2021  ?  9:47 AM 06/09/2021  ?  2:44 PM 04/01/2021  ?  8:38 AM  ?Depression screen PHQ 2/9  ?Decreased Interest 1 0 0 0 0  ?Down, Depressed, Hopeless 0 0 0 0 0  ?PHQ - 2 Score 1 0 0 0 0  ?Altered sleeping 0 0 0    ?Tired, decreased energy 0 0 0    ?Change in appetite 0 0 0    ?Feeling bad or failure about yourself  0 0 0    ?Trouble concentrating 0 0 0    ?Moving slowly or fidgety/restless 0 0 0    ?Suicidal thoughts 0 0 0    ?PHQ-9 Score 1 0 0    ?Difficult doing work/chores   Not difficult at all    ? ? ?Hypertension:  ?BP Readings from Last 3 Encounters:  ?02/19/22 120/84  ?12/14/21 118/70  ?10/18/21 135/89  ? ? ?Obesity: ?Wt Readings from Last 3 Encounters:  ?02/19/22 (!) 305 lb (138.3 kg)  ?12/14/21 (!) 317 lb (143.8 kg)  ?10/18/21 (!) 315 lb (142.9 kg)  ? ?BMI Readings from Last 3 Encounters:  ?02/19/22 47.77 kg/m?  ?12/14/21 49.65 kg/m?  ?10/18/21 49.34 kg/m?  ?  ? ?Lipids:  ?Lab Results  ?Component Value Date  ? CHOL 210 (H) 02/17/2021  ? CHOL 181 02/13/2019  ? CHOL 167 12/19/2017  ? ?Lab Results  ?Component Value Date  ? HDL 43 02/17/2021  ? HDL 48 02/13/2019  ? HDL 45 12/19/2017  ? ?Lab Results  ?Component Value Date  ? LDLCALC 124 (H) 02/17/2021  ? LDLCALC 112 (H) 02/13/2019  ? LDLCALC 104 (H) 12/19/2017  ? ?Lab Results  ?Component Value Date  ? TRIG 298 (H) 02/17/2021  ? TRIG 106 02/13/2019  ? TRIG 89 12/19/2017  ? ?Lab Results  ?Component Value Date  ? CHOLHDL 4.9 02/17/2021  ? CHOLHDL 3.8 02/13/2019  ? CHOLHDL 3.7 12/19/2017  ? ?No results found for: LDLDIRECT ?Glucose:  ?Glucose, Bld  ?Date Value Ref Range Status  ?02/17/2021 102 (H) 65 - 99 mg/dL Final   ?  Comment:  ?  . ?           Fasting reference interval ?. ?For someone without known diabetes, a glucose value ?between 100 and 125 mg/dL is consistent with ?prediabetes and should be confirmed with a ?follow-up test. ?. ?  ?03/20/2020 140 (H) 70 - 99 mg/dL Final  ?  Comment:  ?  Glucose reference range applies only to samples taken after fasting for at least 8 hours.  ?03/19/2020 136 (H) 70 - 99 mg/dL Final  ?  Comment:  ?  Glucose reference range applies only to samples taken after fasting for at least 8 hours.  ? ? ?Flowsheet Row Video Visit from [facility]/20[facility] in [facility]  ?AUDIT-C Score 1  ? ?  ? ? ?Married ?STD testing and prevention (HIV/chl/gon/syphilis):  03/18/20 ?Hep C Screening: 02/17/21 ?Skin cancer: Discussed monitoring for atypical lesions ?Colorectal cancer: N/A ?Prostate cancer:  N/A ? ? ?Lung cancer:  Low Dose  CT Chest recommended if Age 52-80 years, 30 pack-year currently smoking OR have quit w/in 15years. Patient  not applicable  ?AAA: The USPSTF recommends one-time screening with ultrasonography in men ages 25 to 75 years who have ever smoked. Patient:  not applicable  ?ECG:  03/21/20 ? ?Vaccines:  ? ?HPV:he had it as a teenager  ?Tdap: up to date ?Shingrix: N/A ?Pneumonia: N/A ?Flu: 2017, discussed yearly vaccine ?COVID-19: discussed booster  ? ?Advanced Care Planning: A voluntary discussion about advance care planning including the explanation and discussion of advance directives.  Discussed health care proxy and Living will, and the patient was able to identify a health care proxy as wife .  Patient does not have a living will or power of attorney of health care  ? ?Patient Active Problem List  ? Diagnosis Date Noted  ? Impetigo 10/18/2021  ? History of COVID-19 03/17/2020  ? Snoring 02/13/2019  ? Nocturnal enuresis 02/13/2019  ? Fever blister 07/19/2017  ? History of depression 01/27/2016  ? Morbid obesity ([facility]) 01/27/2016  ? ? ?No past surgical history on file. ? ?Family  History  ?Problem Relation Age of Onset  ? Depression Mother   ? Obesity Mother   ? Obesity Father   ? Obesity Sister   ? ADD / ADHD Brother   ? Obesity Brother   ? ? ?Social History  ? ?Socioeconomic History  ? Marital status: Married  ?  Spouse name: Lesly Rubenstein  ? Number of children: 0  ? Years of education: Not on file  ? Highest education level: Some college, no degree  ?Occupational History  ? Occupation: Database administrator   ?  Employer: [id number]  ?  Comment: physical job  ?Tobacco Use  ? Smoking status: Never  ? Smokeless tobacco: Never  ?Vaping Use  ? Vaping Use: Never used  ?Substance and Sexual Activity  ? Alcohol use: Yes  ?  Alcohol/week: 1.0 standard drink  ?  Types: 1 Standard drinks or equivalent per week  ?  Comment: social drinker  ? Drug use: No  ? Sexual activity: Yes  ?  Partners: Female  ?  Comment: wife takes opc  ?Other Topics Concern  ? Not on file  ?Social History Narrative  ? 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 Married since 07/2017  ? ?Social Determinants of Health  ? ?Financial Resource Strain: Low Risk   ? Difficulty of Paying Living Expenses: Not hard at all  ?Food Insecurity: No Food Insecurity  ? Worried About Programme researcher, broadcasting/film/video in the Last Year: Never true  ? Ran Out of Food in the Last Year: Never true  ?Transportation Needs: No Transportation Needs  ? Lack of Transportation (Medical): No  ? Lack of Transportation (Non-Medical): No  ?Physical Activity: Insufficiently Active  ? Days of Exercise per Week: 3 days  ? Minutes of Exercise per Session: 40 min  ?Stress: No Stress Concern Present  ? Feeling of Stress : Not at all  ?Social Connections: Socially Isolated  ? Frequency of Communication with Friends and Family: Once a week  ? Frequency of Social Gatherings with Friends and Family: Once a week  ? Attends Religious Services: Never  ? Active Member of Clubs or Organizations: No  ? Attends Banker Meetings: Never  ? Marital Status: Married   ?Intimate Partner Violence: Not At Risk  ? Fear of Current or Ex-Partner: No  ? Emotionally Abused: No  ? Physically Abused:  No  ? Sexually Abused: No  ? ? ? ?Current Outpatient Medications:  ?  amphetamine-dextroamphetamine (ADDERALL XR) 20 MG 24 hr capsule, Take 1 capsule (20 mg total) by mouth every morning., Disp: 30 capsule, Rfl: 0 ?  Semaglutide, 1 MG/DOSE, 4 MG/3ML SOPN, Inject 1 mg as directed once a week., Disp: 9 mL, Rfl: 0 ?  valACYclovir (VALTREX) 1000 MG tablet, Take 1 tablet (1,000 mg total) by mouth 2 (two) times daily as needed. Prn outbreak, Disp: , Rfl:  ? ?No Known Allergies ? ? ?ROS ? ?Constitutional: Negative for fever, positive for weight change.  ?Respiratory: Negative for cough and shortness of breath.   ?Cardiovascular: Negative for chest pain or palpitations.  ?Gastrointestinal: Negative for abdominal pain, no bowel changes.  ?Musculoskeletal: Negative for gait problem or joint swelling.  ?Skin: Negative for rash.  ?Neurological: Negative for dizziness or headache.  ?No other specific complaints in a complete review of systems (except as listed in HPI above).  ? ? ?Objective ? ?Vitals:  ? 02/19/22 0806  ?BP: 120/84  ?Pulse: 90  ?Resp: 16  ?SpO2: 99%  ?Weight: (!) 305 lb (138.3 kg)  ?Height: 5\' 7"  (1.702 m)  ? ? ?Body mass index is 47.77 kg/m?. ? ?Physical Exam ? ?Constitutional: Patient appears well-developed and well-nourished. No distress.  ?HENT: Head: Normocephalic and atraumatic. Ears: B TMs ok, no erythema or effusion; Nose: Nose normal. Mouth/Throat: Oropharynx is clear and moist. No oropharyngeal exudate.  ?Eyes: Conjunctivae and EOM are normal. Pupils are equal, round, and reactive to light. No scleral icterus.  ?Neck: Normal range of motion. Neck supple. No JVD present. No thyromegaly present.  ?Cardiovascular: Normal rate, regular rhythm and normal heart sounds.  No murmur heard. No BLE edema. ?Pulmonary/Chest: Effort normal and breath sounds normal. No respiratory  distress. ?Abdominal: Soft. Bowel sounds are normal, no distension. There is no tenderness. no masses ?MALE GENITALIA: Normal descended testes bilaterally, no masses palpated, no hernias, no lesions, no discharge ?RECTAL:

## 2022-02-19 ENCOUNTER — Ambulatory Visit (INDEPENDENT_AMBULATORY_CARE_PROVIDER_SITE_OTHER): Payer: BC Managed Care – PPO | Admitting: Family Medicine

## 2022-02-19 ENCOUNTER — Encounter: Payer: Self-pay | Admitting: Family Medicine

## 2022-02-19 VITALS — BP 120/84 | HR 90 | Resp 16 | Ht 67.0 in | Wt 305.0 lb

## 2022-02-19 DIAGNOSIS — Z79899 Other long term (current) drug therapy: Secondary | ICD-10-CM

## 2022-02-19 DIAGNOSIS — Z131 Encounter for screening for diabetes mellitus: Secondary | ICD-10-CM

## 2022-02-19 DIAGNOSIS — Z0000 Encounter for general adult medical examination without abnormal findings: Secondary | ICD-10-CM

## 2022-02-19 DIAGNOSIS — E785 Hyperlipidemia, unspecified: Secondary | ICD-10-CM

## 2022-02-20 LAB — COMPLETE METABOLIC PANEL WITH GFR
AG Ratio: 1.8 (calc) (ref 1.0–2.5)
ALT: 21 U/L (ref 9–46)
AST: 18 U/L (ref 10–40)
Albumin: 4.6 g/dL (ref 3.6–5.1)
Alkaline phosphatase (APISO): 76 U/L (ref 36–130)
BUN: 10 mg/dL (ref 7–25)
CO2: 24 mmol/L (ref 20–32)
Calcium: 9.5 mg/dL (ref 8.6–10.3)
Chloride: 105 mmol/L (ref 98–110)
Creat: 0.93 mg/dL (ref 0.60–1.24)
Globulin: 2.5 g/dL (calc) (ref 1.9–3.7)
Glucose, Bld: 76 mg/dL (ref 65–99)
Potassium: 4.4 mmol/L (ref 3.5–5.3)
Sodium: 140 mmol/L (ref 135–146)
Total Bilirubin: 0.5 mg/dL (ref 0.2–1.2)
Total Protein: 7.1 g/dL (ref 6.1–8.1)
eGFR: 114 mL/min/{1.73_m2} (ref >=60)

## 2022-02-20 LAB — CBC WITH DIFFERENTIAL/PLATELET
Absolute Monocytes: 577 cells/uL (ref 200–950)
Basophils Absolute: 51 cells/uL (ref 0–200)
Basophils Relative: 0.7 %
Eosinophils Absolute: 168 cells/uL (ref 15–500)
Eosinophils Relative: 2.3 %
HCT: 43.1 % (ref 38.5–50.0)
Hemoglobin: 14.1 g/dL (ref 13.2–17.1)
Lymphs Abs: 2336 cells/uL (ref 850–3900)
MCH: 26.1 pg — ABNORMAL LOW (ref 27.0–33.0)
MCHC: 32.7 g/dL (ref 32.0–36.0)
MCV: 79.8 fL — ABNORMAL LOW (ref 80.0–100.0)
MPV: 11.5 fL (ref 7.5–12.5)
Monocytes Relative: 7.9 %
Neutro Abs: 4168 cells/uL (ref 1500–7800)
Neutrophils Relative %: 57.1 %
Platelets: 209 10*3/uL (ref 140–400)
RBC: 5.4 10*6/uL (ref 4.20–5.80)
RDW: 13.8 % (ref 11.0–15.0)
Total Lymphocyte: 32 %
WBC: 7.3 10*3/uL (ref 3.8–10.8)

## 2022-02-20 LAB — HEMOGLOBIN A1C
Hgb A1c MFr Bld: 5.3 % of total Hgb (ref <5.7)
Mean Plasma Glucose: 105 mg/dL
eAG (mmol/L): 5.8 mmol/L

## 2022-02-20 LAB — LIPID PANEL
Cholesterol: 154 mg/dL (ref <200)
HDL: 43 mg/dL (ref >=40)
LDL Cholesterol (Calc): 97 mg/dL (calc)
Non-HDL Cholesterol (Calc): 111 mg/dL (calc) (ref <130)
Total CHOL/HDL Ratio: 3.6 (calc) (ref <5.0)
Triglycerides: 46 mg/dL (ref <150)

## 2022-03-15 NOTE — Progress Notes (Signed)
Name: Derek Khan   MRN: [id number]    DOB: Jun 24, 1991   Date:03/16/2022 ? ?     Progress Note ? ?Subjective ? ?Chief Complaint ? ?Follow Up ? ?HPI ? ?Morbid Obesity: he has always been obese. His highest weight was 333 lbs Summer 2022 . He was already cutting down on junk food, packing lunch, going for walks with his wife 4x5 times a week , he had lost weight with Adderal from 07/22 until 10/22 he was down to 319 lbs, he asked for additional help and we gave him Ozempic,doing well now. Weight is down to 304 lbs, he states since started medication in January his lowest weight at home was 296 lbs, he gained some weight over this past weekend because he was on vacation.  ? ?Metabolic syndrome: he has increase in abdominal girth, elevated fasting glucose and high triglycerides, so we started him on Ozempic 11/2021 and he has been tolerating medication well.  ? ?Dyslipidemia: doing well with weight loss and dietary changes  ? ?ADD: he was diagnosed in childhood, stopped taking medication years ago, but resumed Summer 2022 , he stopped for a few months but is back on medication and is able to stay on focus , we will send refills to pharmacy  ? ?Patient Active Problem List  ? Diagnosis Date Noted  ? Impetigo 10/18/2021  ? History of COVID-19 03/17/2020  ? Snoring 02/13/2019  ? Nocturnal enuresis 02/13/2019  ? Fever blister 07/19/2017  ? History of depression 01/27/2016  ? Morbid obesity ([place]) 01/27/2016  ? ? ?No past surgical history on file. ? ?Family History  ?Problem Relation Age of Onset  ? Depression Mother   ? Obesity Mother   ? Obesity Father   ? Obesity Sister   ? ADD / ADHD Brother   ? Obesity Brother   ? ? ?Social History  ? ?Tobacco Use  ? Smoking status: Never  ? Smokeless tobacco: Never  ?Substance Use Topics  ? Alcohol use: Yes  ?  Alcohol/week: 1.0 standard drink  ?  Types: 1 Standard drinks or equivalent per week  ?  Comment: social drinker  ? ? ? ?Current Outpatient Medications:  ?   amphetamine-dextroamphetamine (ADDERALL XR) 20 MG 24 hr capsule, Take 1 capsule (20 mg total) by mouth every morning., Disp: 30 capsule, Rfl: 0 ?  Semaglutide, 1 MG/DOSE, 4 MG/3ML SOPN, Inject 1 mg as directed once a week., Disp: 9 mL, Rfl: 0 ?  valACYclovir (VALTREX) 1000 MG tablet, Take 1 tablet (1,000 mg total) by mouth 2 (two) times daily as needed. Prn outbreak, Disp: , Rfl:  ? ?No Known Allergies ? ?I personally reviewed active problem list, medication list, allergies, family history, social history, health maintenance with the patient/caregiver today. ? ? ?ROS ? ?Constitutional: Negative for fever or weight change.  ?Respiratory: Negative for cough and shortness of breath.   ?Cardiovascular: Negative for chest pain or palpitations.  ?Gastrointestinal: Negative for abdominal pain, no bowel changes.  ?Musculoskeletal: Negative for gait problem or joint swelling.  ?Skin: Negative for rash.  ?Neurological: Negative for dizziness or headache.  ?No other specific complaints in a complete review of systems (except as listed in HPI above).  ? ?Objective ? ?Vitals:  ? 03/16/22 1443  ?BP: 126/72  ?Pulse: 91  ?Resp: 16  ?SpO2: 98%  ?Weight: (!) 304 lb (137.9 kg)  ?Height: _0  (1.702 m)  ? ? ?Body mass index is 47.61 kg/m?. ? ?Physical Exam ? ?Constitutional: Patient appears well-developed  and well-nourished. Obese  No distress.  ?HEENT: head atraumatic, normocephalic, pupils equal and reactive to light,  neck supple ?Cardiovascular: Normal rate, regular rhythm and normal heart sounds.  No murmur heard. No BLE edema. ?Pulmonary/Chest: Effort normal and breath sounds normal. No respiratory distress. ?Abdominal: Soft.  There is no tenderness. ?Psychiatric: Patient has a normal mood and affect. behavior is normal. Judgment and thought content normal.  ? ?Recent Results (from the past 2160 hour(s))  ?Lipid panel     Status: None  ? Collection Time: 02/19/22  8:44 AM  ?Result Value Ref Range  ? Cholesterol 154 <200 mg/dL  ?  HDL 43 > OR = 40 mg/dL  ? Triglycerides 46 <150 mg/dL  ? LDL Cholesterol (Calc) 97 mg/dL (calc)  ?  Comment: Reference range: <100 ?Marland Kitchen ?Desirable range <100 mg/dL for primary prevention;   ?<70 mg/dL for patients with CHD or diabetic patients  ?with > or = 2 CHD risk factors. ?. ?LDL-C is now calculated using the Martin-Hopkins  ?calculation, which is a validated novel method providing  ?better accuracy than the Friedewald equation in the  ?estimation of LDL-C.  ?Cresenciano Genre et al. Annamaria Helling. 0086;761(95): 2061-2068  ?(http://education.[facility].com/faq/FAQ164) ?  ? Total CHOL/HDL Ratio 3.6 <5.0 (calc)  ? Non-HDL Cholesterol (Calc) 111 <130 mg/dL (calc)  ?  Comment: For patients with diabetes plus 1 major ASCVD risk  ?factor, treating to a non-HDL-C goal of <100 mg/dL  ?(LDL-C of <70 mg/dL) is considered a therapeutic  ?option. ?  ?COMPLETE METABOLIC PANEL WITH GFR     Status: None  ? Collection Time: 02/19/22  8:44 AM  ?Result Value Ref Range  ? Glucose, Bld 76 65 - 99 mg/dL  ?  Comment: . ?           Fasting reference interval ?. ?  ? BUN 10 7 - 25 mg/dL  ? Creat 0.93 0.60 - 1.24 mg/dL  ? eGFR 114 > OR = 60 mL/min/1.28m  ?  Comment: The eGFR is based on the CKD-EPI 2021 equation. To calculate  ?the new eGFR from a previous Creatinine or Cystatin C ?result, go to https://www.kidney.org/professionals/ ?kdoqi/gfr%5Fcalculator ?  ? BUN/Creatinine Ratio NOT APPLICABLE 6 - 22 (calc)  ? Sodium 140 135 - 146 mmol/L  ? Potassium 4.4 3.5 - 5.3 mmol/L  ? Chloride 105 98 - 110 mmol/L  ? CO2 24 20 - 32 mmol/L  ? Calcium 9.5 8.6 - 10.3 mg/dL  ? Total Protein 7.1 6.1 - 8.1 g/dL  ? Albumin 4.6 3.6 - 5.1 g/dL  ? Globulin 2.5 1.9 - 3.7 g/dL (calc)  ? AG Ratio 1.8 1.0 - 2.5 (calc)  ? Total Bilirubin 0.5 0.2 - 1.2 mg/dL  ? Alkaline phosphatase (APISO) 76 36 - 130 U/L  ? AST 18 10 - 40 U/L  ? ALT 21 9 - 46 U/L  ?CBC with Differential/Platelet     Status: Abnormal  ? Collection Time: 02/19/22  8:44 AM  ?Result Value Ref Range  ? WBC  7.3 3.8 - 10.8 Thousand/uL  ? RBC 5.40 4.20 - 5.80 Million/uL  ? Hemoglobin 14.1 13.2 - 17.1 g/dL  ? HCT 43.1 38.5 - 50.0 %  ? MCV 79.8 (L) 80.0 - 100.0 fL  ? MCH 26.1 (L) 27.0 - 33.0 pg  ? MCHC 32.7 32.0 - 36.0 g/dL  ? RDW 13.8 11.0 - 15.0 %  ? Platelets 209 140 - 400 Thousand/uL  ? MPV 11.5 7.5 - 12.5 fL  ? Neutro Abs  4,168 1,500 - 7,800 cells/uL  ? Lymphs Abs 2,336 850 - 3,900 cells/uL  ? Absolute Monocytes 577 200 - 950 cells/uL  ? Eosinophils Absolute 168 15 - 500 cells/uL  ? Basophils Absolute 51 0 - 200 cells/uL  ? Neutrophils Relative % 57.1 %  ? Total Lymphocyte 32.0 %  ? Monocytes Relative 7.9 %  ? Eosinophils Relative 2.3 %  ? Basophils Relative 0.7 %  ?Hemoglobin A1c     Status: None  ? Collection Time: 02/19/22  8:44 AM  ?Result Value Ref Range  ? Hgb A1c MFr Bld 5.3 <5.7 % of total Hgb  ?  Comment: For the purpose of screening for the presence of ?diabetes: ?. ?<5.7%       Consistent with the absence of diabetes ?5.7-6.4%    Consistent with increased risk for diabetes ?            (prediabetes) ?> or =6.5%  Consistent with diabetes ?Marland Kitchen ?This assay result is consistent with a decreased risk ?of diabetes. ?. ?Currently, no consensus exists regarding use of ?hemoglobin A1c for diagnosis of diabetes in children. ?. ?According to American Diabetes Association (ADA) ?guidelines, hemoglobin A1c <7.0% represents optimal ?control in non-pregnant diabetic patients. Different ?metrics may apply to specific patient populations.  ?Standards of Medical Care in Diabetes(ADA). ?. ?  ? Mean Plasma Glucose 105 mg/dL  ? eAG (mmol/L) 5.8 mmol/L  ? ? ?PHQ2/9: ? ?  03/16/2022  ?  2:43 PM 02/19/2022  ?  8:06 AM 12/14/2021  ? 11:03 AM 09/11/2021  ?  9:47 AM 06/09/2021  ?  2:44 PM  ?Depression screen PHQ 2/9  ?Decreased Interest 0 1 0 0 0  ?Down, Depressed, Hopeless 0 0 0 0 0  ?PHQ - 2 Score 0 1 0 0 0  ?Altered sleeping 0 0 0 0   ?Tired, decreased energy 0 0 0 0   ?Change in appetite 0 0 0 0   ?Feeling bad or failure about yourself   0 0 0 0   ?Trouble concentrating 0 0 0 0   ?Moving slowly or fidgety/restless 0 0 0 0   ?Suicidal thoughts 0 0 0 0   ?PHQ-9 Score 0 1 0 0   ?Difficult doing work/chores    Not difficult at all   ?  ?phq 9 is negativ

## 2022-03-16 ENCOUNTER — Encounter: Payer: Self-pay | Admitting: Family Medicine

## 2022-03-16 ENCOUNTER — Other Ambulatory Visit: Payer: Self-pay

## 2022-03-16 ENCOUNTER — Ambulatory Visit: Payer: BC Managed Care – PPO | Admitting: Family Medicine

## 2022-03-16 DIAGNOSIS — F988 Other specified behavioral and emotional disorders with onset usually occurring in childhood and adolescence: Secondary | ICD-10-CM

## 2022-03-16 DIAGNOSIS — E8881 Metabolic syndrome: Secondary | ICD-10-CM | POA: Diagnosis not present

## 2022-03-16 DIAGNOSIS — B001 Herpesviral vesicular dermatitis: Secondary | ICD-10-CM | POA: Diagnosis not present

## 2022-03-16 MED ORDER — VALACYCLOVIR HCL 1 G PO TABS: 1000.0000 mg | ORAL_TABLET | Freq: Two times a day (BID) | ORAL | Status: DC | PRN

## 2022-03-16 MED ORDER — AMPHETAMINE-DEXTROAMPHET ER 20 MG PO CP24
20.0000 mg | ORAL_CAPSULE | ORAL | 0 refills | Status: DC
  Filled 2022-03-16: qty 30, 30d supply, fill #0
  Filled 2022-04-16: qty 30, 30d supply, fill #1

## 2022-03-16 MED ORDER — SEMAGLUTIDE (1 MG/DOSE) 4 MG/3ML ~~LOC~~ SOPN
1.0000 mg | PEN_INJECTOR | SUBCUTANEOUS | 0 refills | Status: DC
  Filled 2022-03-16: qty 3, 28d supply, fill #0

## 2022-03-16 NOTE — Patient Instructions (Addendum)
See if cost of Mounjarno is better or other GLP-1 agonist  ?

## 2022-03-17 ENCOUNTER — Other Ambulatory Visit: Payer: Self-pay

## 2022-03-18 ENCOUNTER — Other Ambulatory Visit: Payer: Self-pay

## 2022-04-16 ENCOUNTER — Other Ambulatory Visit: Payer: Self-pay

## 2022-04-21 ENCOUNTER — Other Ambulatory Visit: Payer: Self-pay

## 2022-04-21 ENCOUNTER — Encounter: Payer: Self-pay | Admitting: Family Medicine

## 2022-04-21 DIAGNOSIS — F988 Other specified behavioral and emotional disorders with onset usually occurring in childhood and adolescence: Secondary | ICD-10-CM

## 2022-04-22 ENCOUNTER — Other Ambulatory Visit: Payer: Self-pay

## 2022-04-26 ENCOUNTER — Other Ambulatory Visit: Payer: Self-pay

## 2022-04-26 MED ORDER — AMPHETAMINE-DEXTROAMPHET ER 20 MG PO CP24: 20.0000 mg | ORAL_CAPSULE | ORAL | 0 refills | Status: DC

## 2022-06-16 ENCOUNTER — Ambulatory Visit: Payer: BC Managed Care – PPO | Admitting: Family Medicine

## 2022-07-19 NOTE — Progress Notes (Unsigned)
Name: Derek Khan   MRN: [id number]    DOB: [birth year]   Date:07/20/2022       Progress Note  Subjective  Chief Complaint  Follow Up  HPI  Morbid Obesity: he has always been obese. His highest weight was 333 lbs Summer 2022 . He states he has been active at work, however very compliant with his diet lately  He took Ozempic,and was doing well, but cost of medication went up.  He has been out of Adderal for the past month. His weight is down below 300 lbs. Today it is 296.6 lbs. He would like to resume Adderal for ADHD but not the Ozempic due to cost.   Metabolic syndrome: he has increase in abdominal girth, elevated fasting glucose and high triglycerides, he took Ozempic but too costly, discussed Metformin   Dyslipidemia: doing well with weight loss , discussed life style modification   ADD: he was diagnosed in childhood, stopped taking medication years ago, but resumed Summer 2022 . He had another gap since July. He works full time, having difficulty focusing, also goes to school, Adderal helps with focus and needs a refill.   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  Family History  Problem Relation Age of Onset   Depression Mother    Obesity Mother    Obesity Father    Obesity Sister    ADD / ADHD Brother    Obesity Brother     Social History   Tobacco Use   Smoking status: Never   Smokeless tobacco: Never  Substance Use Topics   Alcohol use: Yes    Alcohol/week: 1.0 standard drink of alcohol    Types: 1 Standard drinks or equivalent per week    Comment: social drinker     Current Outpatient Medications:     amphetamine-dextroamphetamine (ADDERALL XR) 20 MG 24 hr capsule, Take 1 capsule (20 mg total) by mouth every morning., Disp: 30 capsule, Rfl: 0   amphetamine-dextroamphetamine (ADDERALL XR) 20 MG 24 hr capsule, Take 1 capsule (20 mg total) by mouth every morning., Disp: 30 capsule, Rfl: 0   metFORMIN (GLUCOPHAGE-XR) 500 MG 24 hr tablet, Take 1 tablet (500 mg total) by mouth daily with breakfast., Disp: 90 tablet, Rfl: 0   valACYclovir (VALTREX) 1000 MG tablet, Take 1 tablet (1,000 mg total) by mouth 2 (two) times daily as needed. Prn outbreak, Disp: 12 tablet, Rfl:    amphetamine-dextroamphetamine (ADDERALL XR) 20 MG 24 hr capsule, Take 1 capsule (20 mg total) by mouth every morning., Disp: 30 capsule, Rfl: 0  No Known Allergies  I personally reviewed active problem list, medication list, allergies, family history, social history, health maintenance with the patient/caregiver today.   ROS  Ten systems reviewed and is negative except as mentioned in HPI   Objective  Vitals:   07/20/22 1354  BP: 134/72  Pulse: 97  Resp: 16  Temp: 97.8 F (36.6 C)  TempSrc: Oral  SpO2: 98%  Weight: 296 lb 9.6 oz (134.5 kg)  Height: 5\' 7"  (1.702 m)    Body mass index is 46.45 kg/m.  Physical Exam  Constitutional: Patient appears well-developed and well-nourished.  Obese  No distress.  HEENT: head atraumatic, normocephalic, pupils equal and reactive to light, neck supple Cardiovascular: Normal rate, regular rhythm and normal heart sounds.  No murmur heard. No BLE edema. Pulmonary/Chest: Effort normal and breath sounds normal. No respiratory distress. Abdominal: Soft.  There is no tenderness. Psychiatric: Patient has a normal mood and affect. behavior is normal. Judgment and thought content normal.    PHQ2/9:    07/20/2022    1:53 PM 03/16/2022    2:43 PM 02/19/2022    8:06 AM 12/14/2021   11:03 AM 09/11/2021    9:47 AM  Depression screen PHQ 2/9  Decreased Interest 0 0 1 0 0  Down,  Depressed, Hopeless 0 0 0 0 0  PHQ - 2 Score 0 0 1 0 0  Altered sleeping 0 0 0 0 0  Tired, decreased energy 0 0 0 0 0  Change in appetite 0 0 0 0 0  Feeling bad or failure about yourself  0 0 0 0 0  Trouble concentrating 0 0 0 0 0  Moving slowly or fidgety/restless 0 0 0 0 0  Suicidal thoughts 0 0 0 0 0  PHQ-9 Score 0 0 1 0 0  Difficult doing work/chores Not difficult at all    Not difficult at all    phq 9 is negative   Fall Risk:    07/20/2022    1:53 PM 03/16/2022    2:43 PM 02/19/2022    8:06 AM 12/14/2021   11:03 AM 09/11/2021    9:46 AM  Fall Risk   Falls in the past year? 0 0 0 0 0  Number falls in past yr: 0 0 0 0 0  Injury with Fall? 0 0 0 0 0  Risk for fall due to : No Fall Risks No Fall Risks No Fall Risks No Fall Risks No Fall Risks  Follow up Falls prevention discussed;Education provided Falls prevention discussed Falls prevention discussed Falls prevention discussed Falls prevention discussed      Functional Status Survey: Is the patient deaf or have difficulty hearing?: No Does the patient have difficulty seeing, even when wearing glasses/contacts?: No Does the patient have difficulty concentrating, remembering, or making decisions?: No Does the patient have difficulty walking or climbing stairs?: No Does the patient have difficulty dressing or bathing?: No Does the patient have difficulty doing errands alone such as visiting a doctor's office or shopping?: No    Assessment & Plan  1. Attention deficit disorder (ADD) without hyperactivity  - amphetamine-dextroamphetamine (ADDERALL XR) 20 MG 24 hr capsule; Take 1 capsule (20 mg total) by mouth every morning.  Dispense: 30 capsule; Refill: 0 - amphetamine-dextroamphetamine (ADDERALL XR) 20 MG 24 hr capsule; Take 1 capsule (20 mg total) by mouth every morning.  Dispense: 30 capsule; Refill: 0 - amphetamine-dextroamphetamine (ADDERALL XR) 20 MG 24 hr capsule; Take 1 capsule (20 mg total) by mouth every morning.   Dispense: 30 capsule; Refill: 0  2. Morbid obesity ([facility])  Reminded him importance of balanced diet   3. Metabolic syndrome  - metFORMIN (GLUCOPHAGE-XR) 500 MG 24 hr tablet; Take 1 tablet (500 mg total) by mouth daily with breakfast.  Dispense: 90 tablet; Refill: 0  4. Dyslipidemia

## 2022-07-20 ENCOUNTER — Encounter: Payer: Self-pay | Admitting: Family Medicine

## 2022-07-20 ENCOUNTER — Ambulatory Visit: Payer: BC Managed Care – PPO | Admitting: Family Medicine

## 2022-07-20 VITALS — BP 134/72 | HR 97 | Temp 97.8°F | Resp 16 | Ht 67.0 in | Wt 296.6 lb

## 2022-07-20 DIAGNOSIS — F988 Other specified behavioral and emotional disorders with onset usually occurring in childhood and adolescence: Secondary | ICD-10-CM | POA: Diagnosis not present

## 2022-07-20 DIAGNOSIS — E8881 Metabolic syndrome: Secondary | ICD-10-CM

## 2022-07-20 DIAGNOSIS — E785 Hyperlipidemia, unspecified: Secondary | ICD-10-CM | POA: Diagnosis not present

## 2022-07-20 MED ORDER — AMPHETAMINE-DEXTROAMPHET ER 20 MG PO CP24: 20.0000 mg | ORAL_CAPSULE | ORAL | 0 refills | Status: DC

## 2022-07-20 MED ORDER — METFORMIN HCL ER 500 MG PO TB24: 500.0000 mg | ORAL_TABLET | Freq: Every day | ORAL | 0 refills | Status: DC

## 2022-07-20 MED ORDER — AMPHETAMINE-DEXTROAMPHET ER 20 MG PO CP24
20.0000 mg | ORAL_CAPSULE | ORAL | 0 refills | Status: DC
Start: 2022-07-20 — End: 2022-10-05

## 2022-10-04 NOTE — Progress Notes (Unsigned)
Name: Derek Khan   MRN: [id number]    DOB: May 19, 1991   Date:10/05/2022       Progress Note  Subjective  Chief Complaint  Follow Up  HPI  Morbid Obesity: he has always been obese. His highest weight was 333 lbs Summer 2022 . 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  He took Ozempic,and was doing well, but cost of medication went up.  He has not been very compliant with a healthy diet lately   Metabolic syndrome: he has increase in abdominal girth, elevated fasting glucose and high triglycerides, he took Ozempic but too costly, we switched to Metformin during the Summer but he never started due to possible diarrhea as a side effects   Dyslipidemia: doing well with weight loss , last LDL was at goal - he was taking Ozempic at the time   ADD: he was diagnosed in childhood, stopped taking medication years ago, but resumed Summer 2022 . He thought he did not have refills and has been out of Adderal for the past couple of months. We will try switching to Vyvanse 40 mg and also give him 90 day supply to increase compliance. He works full time, having difficulty focusing, Adderal helps with focus but does not last all day . 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 No pertinent surgical history.  Family History  Problem Relation Age of Onset   Depression Mother    Obesity Mother    Obesity Father    Obesity Sister    ADD / ADHD Brother    Obesity Brother     Social History   Tobacco Use    Smoking status: Never   Smokeless tobacco: Never  Substance Use Topics   Alcohol use: Yes    Alcohol/week: 1.0 standard drink of alcohol    Types: 1 Standard drinks or equivalent per week    Comment: social drinker     Current Outpatient Medications:    lisdexamfetamine (VYVANSE) 40 MG capsule, Take 1 capsule (40 mg total) by mouth every morning., Disp: 90 capsule, Rfl: 0   metFORMIN (GLUCOPHAGE-XR) 500 MG 24 hr tablet, Take 1 tablet (500 mg total) by mouth daily with breakfast., Disp: 90 tablet, Rfl: 0   valACYclovir (VALTREX) 1000 MG tablet, Take 1 tablet (1,000 mg total) by mouth 2 (two) times daily as needed. Prn outbreak, Disp: 12 tablet, Rfl:   No Known Allergies  I personally reviewed active problem list, medication list, allergies, family history, social history, health maintenance with the patient/caregiver today.   ROS  Constitutional: Negative for fever or weight change.  Respiratory: Negative for cough and shortness of breath.   Cardiovascular: Negative for chest pain or palpitations.  Gastrointestinal: Negative for abdominal pain, no bowel changes.  Musculoskeletal: Negative for gait problem or joint swelling.  Skin: Negative for rash.  Neurological: Negative for dizziness or headache.  No other specific complaints in a complete review of systems (except as listed in HPI  above).   Objective  Vitals:   10/05/22 1428  BP: 128/70  Pulse: 95  Resp: 16  Temp: 98 F (36.7 C)  TempSrc: Oral  SpO2: 98%  Weight: 296 lb 4.8 oz (134.4 kg)  Height: 5\' 7"  (1.702 m)    Body mass index is 46.41 kg/m.  Physical Exam  Constitutional: Patient appears well-developed and well-nourished. Obese  No distress.  HEENT: head atraumatic, normocephalic, pupils equal and reactive to light, neck supple Cardiovascular: Normal rate, regular rhythm and normal heart sounds.  No murmur heard. No BLE edema. Pulmonary/Chest: Effort normal and breath sounds normal. No respiratory  distress. Abdominal: Soft.  There is no tenderness. Psychiatric: Patient has a normal mood and affect. behavior is normal. Judgment and thought content normal.   PHQ2/9:    10/05/2022    2:30 PM 07/20/2022    1:53 PM 03/16/2022    2:43 PM 02/19/2022    8:06 AM 12/14/2021   11:03 AM  Depression screen PHQ 2/9  Decreased Interest 0 0 0 1 0  Down, Depressed, Hopeless 0 0 0 0 0  PHQ - 2 Score 0 0 0 1 0  Altered sleeping 0 0 0 0 0  Tired, decreased energy 0 0 0 0 0  Change in appetite 0 0 0 0 0  Feeling bad or failure about yourself  0 0 0 0 0  Trouble concentrating 0 0 0 0 0  Moving slowly or fidgety/restless 0 0 0 0 0  Suicidal thoughts 0 0 0 0 0  PHQ-9 Score 0 0 0 1 0  Difficult doing work/chores  Not difficult at all       phq 9 is negative   Fall Risk:    10/05/2022    2:29 PM 07/20/2022    1:53 PM 03/16/2022    2:43 PM 02/19/2022    8:06 AM 12/14/2021   11:03 AM  Fall Risk   Falls in the past year? 0 0 0 0 0  Number falls in past yr:  0 0 0 0  Injury with Fall?  0 0 0 0  Risk for fall due to : No Fall Risks No Fall Risks No Fall Risks No Fall Risks No Fall Risks  Follow up Falls prevention discussed Falls prevention discussed;Education provided Falls prevention discussed Falls prevention discussed Falls prevention discussed      Assessment & Plan  1. Attention deficit disorder (ADD) without hyperactivity  - lisdexamfetamine (VYVANSE) 40 MG capsule; Take 1 capsule (40 mg total) by mouth every morning.  Dispense: 90 capsule; Refill: 0  2. Morbid obesity ([facility])  Discussed with the patient the risk posed by an increased BMI. Discussed importance of portion control, calorie counting and at least 150 minutes of physical activity weekly. Avoid sweet beverages and drink more water. Eat at least 6 servings of fruit and vegetables daily    3. Needs flu shot  refused  4. Dyslipidemia  Last level was much better   5. Metabolic syndrome  He did not start metformin,  advised him to start

## 2022-10-05 ENCOUNTER — Encounter: Payer: Self-pay | Admitting: Family Medicine

## 2022-10-05 ENCOUNTER — Ambulatory Visit: Payer: BC Managed Care – PPO | Admitting: Family Medicine

## 2022-10-05 VITALS — BP 128/70 | HR 95 | Temp 98.0°F | Resp 16 | Ht 67.0 in | Wt 296.3 lb

## 2022-10-05 DIAGNOSIS — E8881 Metabolic syndrome: Secondary | ICD-10-CM

## 2022-10-05 DIAGNOSIS — F988 Other specified behavioral and emotional disorders with onset usually occurring in childhood and adolescence: Secondary | ICD-10-CM | POA: Diagnosis not present

## 2022-10-05 DIAGNOSIS — Z23 Encounter for immunization: Secondary | ICD-10-CM

## 2022-10-05 DIAGNOSIS — E785 Hyperlipidemia, unspecified: Secondary | ICD-10-CM | POA: Diagnosis not present

## 2022-10-05 MED ORDER — LISDEXAMFETAMINE DIMESYLATE 40 MG PO CAPS: 40.0000 mg | ORAL_CAPSULE | ORAL | 0 refills | Status: DC

## 2022-10-09 ENCOUNTER — Other Ambulatory Visit: Payer: Self-pay | Admitting: Family Medicine

## 2022-10-09 DIAGNOSIS — E8881 Metabolic syndrome: Secondary | ICD-10-CM

## 2022-10-11 MED ORDER — METFORMIN HCL ER 500 MG PO TB24: 500.0000 mg | ORAL_TABLET | Freq: Every day | ORAL | 0 refills | Status: DC

## 2023-01-06 NOTE — Progress Notes (Signed)
Name: Derek Khan   MRN: [id number]    DOB: 15-Oct-1991   Date:01/07/2023       Progress Note  Subjective  Chief Complaint  Follow Up  HPI  Morbid Obesity: he has always been obese. His highest weight was 333 lbs Summer 2022 today is down to 290  . He states he has been active at work - he is a Librarian, academic for [employer] and walks at least 8 miles per day.  He took Ozempic,and was doing well, but cost of medication went up and he stopped it Spring 2023 .  He has not been very compliant with a healthy diet lately and taking Metformin for metabolic syndrome.    Metabolic syndrome: he has increase in abdominal girth, elevated fasting glucose and high triglycerides, he took Ozempic but too costly, we switched to Metformin but he only started medication Fall 2023, he is doing well , he lost another 6 lbs since last visit three months ago   Dyslipidemia: doing well with weight loss , last LDL improved , we will recheck it during his CPE  ADD: he was diagnosed in childhood, stopped taking medication years ago, but resumed Summer 2022 . He states Vyvanse works well for him, not wearing off at the end of the day, Adderal caused side effects and did not last all day.   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) 01/27/2016    No past surgical history on file.  Family History  Problem Relation Age of Onset   Depression Mother    Obesity Mother    Obesity Father    Obesity Sister    ADD / ADHD Brother    Obesity Brother     Social History   Tobacco Use   Smoking status: Never   Smokeless tobacco: Never  Substance Use Topics   Alcohol use: Yes    Alcohol/week: 1.0 standard  drink of alcohol    Types: 1 Standard drinks or equivalent per week    Comment: social drinker     Current Outpatient Medications:    lisdexamfetamine (VYVANSE) 40 MG capsule, Take 1 capsule (40 mg total) by mouth every morning., Disp: 90 capsule, Rfl: 0   metFORMIN (GLUCOPHAGE-XR) 500 MG 24 hr tablet, Take 1 tablet (500 mg total) by mouth daily with breakfast., Disp: 90 tablet, Rfl: 0   valACYclovir (VALTREX) 1000 MG tablet, Take 1 tablet (1,000 mg total) by mouth 2 (two) times daily as needed. Prn outbreak, Disp: 12 tablet, Rfl:   No Known Allergies  I personally reviewed active problem list, medication list, allergies, family history, social history, health maintenance with the patient/caregiver today.   ROS  Constitutional: Negative for fever, positive for  weight change.  Respiratory: Negative for cough and shortness of breath.   Cardiovascular: Negative for chest pain or palpitations.  Gastrointestinal: Negative for abdominal pain, no bowel changes.  Musculoskeletal: Negative for gait problem or joint swelling.  Skin: Negative for rash.  Neurological: Negative for dizziness or headache.  No other specific complaints in a complete review of systems (except as listed in HPI above).   Objective  Vitals:   01/07/23 0816  BP: 118/72  Pulse: 100  Resp:  16  SpO2: 97%  Weight: 290 lb (131.5 kg)  Height: 5' 7"$  (1.702 m)    Body mass index is 45.42 kg/m.  Physical Exam  Constitutional: Patient appears well-developed and well-nourished. Obese  No distress.  HEENT: head atraumatic, normocephalic, pupils equal and reactive to light, neck supple Cardiovascular: Normal rate, regular rhythm and normal heart sounds.  No murmur heard. No BLE edema. Pulmonary/Chest: Effort normal and breath sounds normal. No respiratory distress. Abdominal: Soft.  There is no tenderness. Psychiatric: Patient has a normal mood and affect. behavior is normal. Judgment and thought content normal.    PHQ2/9:    01/07/2023    8:15 AM 10/05/2022    2:30 PM 07/20/2022    1:53 PM 03/16/2022    2:43 PM 02/19/2022    8:06 AM  Depression screen PHQ 2/9  Decreased Interest 1 0 0 0 1  Down, Depressed, Hopeless 1 0 0 0 0  PHQ - 2 Score 2 0 0 0 1  Altered sleeping 0 0 0 0 0  Tired, decreased energy 0 0 0 0 0  Change in appetite 0 0 0 0 0  Feeling bad or failure about yourself  0 0 0 0 0  Trouble concentrating 0 0 0 0 0  Moving slowly or fidgety/restless 0 0 0 0 0  Suicidal thoughts 0 0 0 0 0  PHQ-9 Score 2 0 0 0 1  Difficult doing work/chores   Not difficult at all      phq 9 is positive   Fall Risk:    01/07/2023    8:15 AM 10/05/2022    2:29 PM 07/20/2022    1:53 PM 03/16/2022    2:43 PM 02/19/2022    8:06 AM  Fall Risk   Falls in the past year? 0 0 0 0 0  Number falls in past yr: 0  0 0 0  Injury with Fall? 0  0 0 0  Risk for fall due to : No Fall Risks No Fall Risks No Fall Risks No Fall Risks No Fall Risks  Follow up Falls prevention discussed Falls prevention discussed Falls prevention discussed;Education provided Falls prevention discussed Falls prevention discussed      Functional Status Survey: Is the patient deaf or have difficulty hearing?: No Does the patient have difficulty seeing, even when wearing glasses/contacts?: No Does the patient have difficulty concentrating, remembering, or making decisions?: No Does the patient have difficulty walking or climbing stairs?: No Does the patient have difficulty dressing or bathing?: No Does the patient have difficulty doing errands alone such as visiting a doctor's office or shopping?: No    Assessment & Plan  1. Morbid obesity ([place])  Discussed with the patient the risk posed by an increased BMI. Discussed importance of portion control, calorie counting and at least 150 minutes of physical activity weekly. Avoid sweet beverages and drink more water. Eat at least 6 servings of fruit and vegetables daily    2.  Dyslipidemia   3. Attention deficit disorder (ADD) without hyperactivity  - lisdexamfetamine (VYVANSE) 40 MG capsule; Take 1 capsule (40 mg total) by mouth every morning.  Dispense: 90 capsule; Refill: 0  4. Metabolic syndrome  - metFORMIN (GLUCOPHAGE-XR) 500 MG 24 hr tablet; Take 1 tablet (500 mg total) by mouth daily with breakfast.  Dispense: 90 tablet; Refill: 0

## 2023-01-07 ENCOUNTER — Encounter: Payer: Self-pay | Admitting: Family Medicine

## 2023-01-07 ENCOUNTER — Ambulatory Visit: Payer: BC Managed Care – PPO | Admitting: Family Medicine

## 2023-01-07 DIAGNOSIS — E8881 Metabolic syndrome: Secondary | ICD-10-CM | POA: Diagnosis not present

## 2023-01-07 DIAGNOSIS — F988 Other specified behavioral and emotional disorders with onset usually occurring in childhood and adolescence: Secondary | ICD-10-CM | POA: Diagnosis not present

## 2023-01-07 DIAGNOSIS — E785 Hyperlipidemia, unspecified: Secondary | ICD-10-CM | POA: Diagnosis not present

## 2023-01-07 MED ORDER — METFORMIN HCL ER 500 MG PO TB24: 500.0000 mg | ORAL_TABLET | Freq: Every day | ORAL | 0 refills | Status: DC

## 2023-01-07 MED ORDER — LISDEXAMFETAMINE DIMESYLATE 40 MG PO CAPS: 40.0000 mg | ORAL_CAPSULE | ORAL | 0 refills | Status: DC

## 2023-01-07 NOTE — Patient Instructions (Signed)
Call insurance to find out if they cover any weight loss medication

## 2023-02-21 NOTE — Patient Instructions (Signed)
Preventive Care 21-31 Years Old, Male Preventive care refers to lifestyle choices and visits with your health care provider that can promote health and wellness. Preventive care visits are also called wellness exams. What can I expect for my preventive care visit? Counseling During your preventive care visit, your health care provider may ask about your: Medical history, including: Past medical problems. Family medical history. Current health, including: Emotional well-being. Home life and relationship well-being. Sexual activity. Lifestyle, including: Alcohol, nicotine or tobacco, and drug use. Access to firearms. Diet, exercise, and sleep habits. Safety issues such as seatbelt and bike helmet use. Sunscreen use. Work and work environment. Physical exam Your health care provider may check your: Height and weight. These may be used to calculate your BMI (body mass index). BMI is a measurement that tells if you are at a healthy weight. Waist circumference. This measures the distance around your waistline. This measurement also tells if you are at a healthy weight and may help predict your risk of certain diseases, such as type 2 diabetes and high blood pressure. Heart rate and blood pressure. Body temperature. Skin for abnormal spots. What immunizations do I need?  Vaccines are usually given at various ages, according to a schedule. Your health care provider will recommend vaccines for you based on your age, medical history, and lifestyle or other factors, such as travel or where you work. What tests do I need? Screening Your health care provider may recommend screening tests for certain conditions. This may include: Lipid and cholesterol levels. Diabetes screening. This is done by checking your blood sugar (glucose) after you have not eaten for a while (fasting). Hepatitis B test. Hepatitis C test. HIV (human immunodeficiency virus) test. STI (sexually transmitted infection)  testing, if you are at risk. Talk with your health care provider about your test results, treatment options, and if necessary, the need for more tests. Follow these instructions at home: Eating and drinking  Eat a healthy diet that includes fresh fruits and vegetables, whole grains, lean protein, and low-fat dairy products. Drink enough fluid to keep your urine pale yellow. Take vitamin and mineral supplements as recommended by your health care provider. Do not drink alcohol if your health care provider tells you not to drink. If you drink alcohol: Limit how much you have to 0-2 drinks a day. Know how much alcohol is in your drink. In the [place] one drink equals one 12 oz bottle of beer (355 mL), one 5 oz glass of wine (148 mL), or one 1 oz glass of hard liquor (44 mL). Lifestyle Brush your teeth every morning and night with fluoride toothpaste. Floss one time each day. Exercise for at least 30 minutes 5 or more days each week. Do not use any products that contain nicotine or tobacco. These products include cigarettes, chewing tobacco, and vaping devices, such as e-cigarettes. If you need help quitting, ask your health care provider. Do not use drugs. If you are sexually active, practice safe sex. Use a condom or other form of protection to prevent STIs. Find healthy ways to manage stress, such as: Meditation, yoga, or listening to music. Journaling. Talking to a trusted person. Spending time with friends and family. Minimize exposure to UV radiation to reduce your risk of skin cancer. Safety Always wear your seat belt while driving or riding in a vehicle. Do not drive: If you have been drinking alcohol. Do not ride with someone who has been drinking. If you have been using any mind-altering substances   or drugs. While texting. When you are tired or distracted. Wear a helmet and other protective equipment during sports activities. If you have firearms in your house, make sure you  follow all gun safety procedures. Seek help if you have been physically or sexually abused. What's next? Go to your health care provider once a year for an annual wellness visit. Ask your health care provider how often you should have your eyes and teeth checked. Stay up to date on all vaccines. This information is not intended to replace advice given to you by your health care provider. Make sure you discuss any questions you have with your health care provider. Document Revised: 05/06/2021 Document Reviewed: 05/06/2021 Elsevier Patient Education  2023 Elsevier Inc.  

## 2023-02-21 NOTE — Progress Notes (Unsigned)
Name: Derek Khan   MRN: [id number]    DOB: 07-10-1991   Date:02/22/2023       Progress Note  Subjective  Chief Complaint  Annual Exam  HPI  Patient presents for annual CPE.   Diet: eating out 90 % of the time, discussed cooking and meal prep to decrease cost and to eat healthier  Exercise: discussed 150 minutes per week  Last Dental Exam: he is due for an exam  Last Eye Exam: he is also due for that   Depression: phq 9 is negative    02/22/2023    7:58 AM 01/07/2023    8:15 AM 10/05/2022    2:30 PM 07/20/2022    1:53 PM 03/16/2022    2:43 PM  Depression screen PHQ 2/9  Decreased Interest 0 1 0 0 0  Down, Depressed, Hopeless 0 1 0 0 0  PHQ - 2 Score 0 2 0 0 0  Altered sleeping 0 0 0 0 0  Tired, decreased energy 0 0 0 0 0  Change in appetite 0 0 0 0 0  Feeling bad or failure about yourself  0 0 0 0 0  Trouble concentrating 0 0 0 0 0  Moving slowly or fidgety/restless 0 0 0 0 0  Suicidal thoughts 0 0 0 0 0  PHQ-9 Score 0 2 0 0 0  Difficult doing work/chores    Not difficult at all     Hypertension:  BP Readings from Last 3 Encounters:  02/22/23 118/70  01/07/23 118/72  10/05/22 128/70    Obesity: Wt Readings from Last 3 Encounters:  02/22/23 290 lb (131.5 kg)  01/07/23 290 lb (131.5 kg)  10/05/22 296 lb 4.8 oz (134.4 kg)   BMI Readings from Last 3 Encounters:  02/22/23 45.42 kg/m  01/07/23 45.42 kg/m  10/05/22 46.41 kg/m     Lipids:  Lab Results  Component Value Date   CHOL 154 02/19/2022   CHOL 210 (H) 02/17/2021   CHOL 181 02/13/2019   Lab Results  Component Value Date   HDL 43 02/19/2022   HDL 43 02/17/2021   HDL 48 02/13/2019   Lab Results  Component Value Date   LDLCALC 97 02/19/2022   LDLCALC 124 (H) 02/17/2021   LDLCALC 112 (H) 02/13/2019   Lab Results  Component Value Date   TRIG 46 02/19/2022   TRIG 298 (H) 02/17/2021   TRIG 106 02/13/2019   Lab Results  Component Value Date   CHOLHDL 3.6 02/19/2022   CHOLHDL 4.9  02/17/2021   CHOLHDL 3.8 02/13/2019   No results found for: "LDLDIRECT" Glucose:  Glucose, Bld  Date Value Ref Range Status  02/19/2022 76 65 - 99 mg/dL Final    Comment:    .            Fasting reference interval .   02/17/2021 102 (H) 65 - 99 mg/dL Final    Comment:    .            Fasting reference interval . For someone without known diabetes, a glucose value between 100 and 125 mg/dL is consistent with prediabetes and should be confirmed with a follow-up test. .   03/20/2020 140 (H) 70 - 99 mg/dL Final    Comment:    Glucose reference range applies only to samples taken after fasting for at least 8 hours.    [place] Office Visit from [facility]/22/2023 in [facility]  AUDIT-C Score 2  Divorced, dating for the past 2 months  STD testing and prevention (HIV/chl/gon/syphilis): 03/18/20, he would like to be checked again today since he has a new partner  Sexual history: he is doing well, one partner, he uses condoms  Hep C Screening: 02/17/21 Skin cancer: Discussed monitoring for atypical lesions Colorectal cancer: N/A Prostate cancer:  N/A   Lung cancer:  Low Dose CT Chest recommended if Age 71-80 years, 30 pack-year currently smoking OR have quit w/in 15years. Patient  no a candidate for screening   AAA: The USPSTF recommends one-time screening with ultrasonography in men ages 8 to 43 years who have ever smoked. Patient   no, a candidate for screening  ECG:  03/21/20  Vaccines:   HPV: up to date  Tdap: up to date Shingrix: N/A Pneumonia: N/A Flu: 2017, refused  COVID-19: up to date  Advanced Care Planning: A voluntary discussion about advance care planning including the explanation and discussion of advance directives.  Discussed health care proxy and Living will, and the patient was able to identify a health care proxy as parents  .  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  Family History  Problem Relation Age of Onset   Depression Mother    Obesity Mother    Obesity Father    Obesity Sister    ADD / ADHD Brother    Obesity Brother     Social History   Socioeconomic History   Marital status: Divorced    Spouse name: Jade   Number of children: 0   Years of education: Not on file   Highest education level: Some college, no degree  Occupational History   Occupation: Engineer, building services     Employer: [employer]    Comment: physical job  Tobacco Use   Smoking status: Never   Smokeless tobacco: Never  Vaping Use   Vaping Use: Never used  Substance and Sexual Activity   Alcohol use: Yes    Alcohol/week: 1.0 standard drink of alcohol    Types: 1 Standard drinks or equivalent per week    Comment: social drinker   Drug use: No   Sexual activity: Yes    Partners: Female    Comment: wife takes opc  Other Topics Concern   Not on file  Social History Narrative   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   Married since 07/2017   Social Determinants of Health   Financial Resource Strain: Low Risk  (02/22/2023)   Overall Financial Resource Strain (CARDIA)    Difficulty of Paying Living Expenses: Not hard at all  Food Insecurity: No Food Insecurity (02/22/2023)   Hunger Vital Sign    Worried About Running Out of Food in the Last Year: Never true    [place] in the Last Year: Never true  Transportation Needs: No Transportation Needs (02/22/2023)   PRAPARE - Hydrologist (Medical): No    Lack of Transportation (Non-Medical): No   Physical Activity: Inactive (02/22/2023)   Exercise Vital  Sign    Days of Exercise per Week: 0 days    Minutes of Exercise per Session: 0 min  Stress: Stress Concern Present (02/22/2023)   [place]    Feeling of Stress : To some extent  Social Connections: Socially Isolated (02/22/2023)   Social Connection and Isolation Panel [NHANES]    Frequency of Communication with Friends and Family: More than three times a week    Frequency of Social Gatherings with Friends and Family: More than three times a week    Attends Religious Services: Never    Marine scientist or Organizations: No    Attends Archivist Meetings: Never    Marital Status: Divorced  Human resources officer Violence: Not At Risk (02/22/2023)   Humiliation, Afraid, Rape, and Kick questionnaire    Fear of Current or Ex-Partner: No    Emotionally Abused: No    Physically Abused: No    Sexually Abused: No     Current Outpatient Medications:    lisdexamfetamine (VYVANSE) 40 MG capsule, Take 1 capsule (40 mg total) by mouth every morning., Disp: 90 capsule, Rfl: 0   metFORMIN (GLUCOPHAGE-XR) 500 MG 24 hr tablet, Take 1 tablet (500 mg total) by mouth daily with breakfast., Disp: 90 tablet, Rfl: 0   valACYclovir (VALTREX) 1000 MG tablet, Take 1 tablet (1,000 mg total) by mouth 2 (two) times daily as needed. Prn outbreak, Disp: 12 tablet, Rfl:   No Known Allergies   ROS  Constitutional: Negative for fever or weight change.  Respiratory: Negative for cough and shortness of breath.   Cardiovascular: Negative for chest pain or palpitations.  Gastrointestinal: Negative for abdominal pain, no bowel changes.  Musculoskeletal: Negative for gait problem or joint swelling.  Skin: Negative for rash.  Neurological: Negative for dizziness or headache.  No other specific complaints in a complete review of systems (except as listed in HPI above).     Objective  Vitals:   02/22/23 0758  BP: 118/70  Pulse: 94  Resp: 16  SpO2: 96%  Weight: 290 lb (131.5 kg)  Height: 5\' 7"  (1.702 m)    Body mass index is 45.42 kg/m.  Physical Exam  Constitutional: Patient appears well-developed and well-nourished Obese . No distress.  HENT: Head: Normocephalic and atraumatic. Ears: B TMs ok, no erythema or effusion; Nose: Nose normal. Mouth/Throat: Oropharynx is clear and moist. No oropharyngeal exudate.  Eyes: Conjunctivae and EOM are normal. Pupils are equal, round, and reactive to light. No scleral icterus.  Neck: Normal range of motion. Neck supple. No JVD present. No thyromegaly present.  Cardiovascular: Normal rate, regular rhythm and normal heart sounds.  No murmur heard. No BLE edema. Pulmonary/Chest: Effort normal and breath sounds normal. No respiratory distress. Abdominal: Soft. Bowel sounds are normal, no distension. There is no tenderness. no masses MALE GENITALIA: Normal descended testes bilaterally, no masses palpated, no hernias, no lesions, no discharge RECTAL: not done  Musculoskeletal: Normal range of motion, no joint effusions. No gross deformities Neurological: he is alert and oriented to person, place, and time. No cranial nerve deficit. Coordination, balance, strength, speech and gait are normal.  Skin: Skin is warm and dry. No rash noted. No erythema.  Psychiatric: Patient has a normal mood and affect. behavior is normal. Judgment and thought content normal.   Fall Risk:    02/22/2023    7:57 AM 01/07/2023    8:15 AM 10/05/2022    2:29 PM 07/20/2022  1:53 PM 03/16/2022    2:43 PM  [place] in the past year? 0 0 0 0 0  Number falls in past yr: 0 0  0 0  Injury with Fall? 0 0  0 0  Risk for fall due to : No Fall Risks No Fall Risks No Fall Risks No Fall Risks No Fall Risks  Follow up Falls prevention discussed Falls prevention discussed Falls prevention discussed Falls prevention discussed;Education  provided Falls prevention discussed     Functional Status Survey: Is the patient deaf or have difficulty hearing?: No Does the patient have difficulty seeing, even when wearing glasses/contacts?: No Does the patient have difficulty concentrating, remembering, or making decisions?: No Does the patient have difficulty walking or climbing stairs?: No Does the patient have difficulty dressing or bathing?: No Does the patient have difficulty doing errands alone such as visiting a doctor's office or shopping?: No    Assessment & Plan  1. Well adult exam  - Lipid panel - CBC with Differential/Platelet - COMPLETE METABOLIC PANEL WITH GFR - Hemoglobin A1c - RPR - HIV Antibody (routine testing w rflx) - Urine cytology ancillary only  2. Dyslipidemia  - Lipid panel  3. Diabetes mellitus screening  - Hemoglobin A1c  4. Long-term use of high-risk medication  - CBC with Differential/Platelet - COMPLETE METABOLIC PANEL WITH GFR  5. Routine screening for STI (sexually transmitted infection)  - RPR - HIV Antibody (routine testing w rflx) - Urine cytology ancillary only     -Prostate cancer screening and PSA options (with potential risks and benefits of testing vs not testing) were discussed along with recent recs/guidelines. -USPSTF grade A and B recommendations reviewed with patient; age-appropriate recommendations, preventive care, screening tests, etc discussed and encouraged; healthy living encouraged; see AVS for patient education given to patient -Discussed importance of 150 minutes of physical activity weekly, eat two servings of fish weekly, eat one serving of tree nuts ( cashews, pistachios, pecans, almonds.Marland Kitchen) every other day, eat 6 servings of fruit/vegetables daily and drink plenty of water and avoid sweet beverages.  -Reviewed Health Maintenance: yes

## 2023-02-22 ENCOUNTER — Ambulatory Visit (INDEPENDENT_AMBULATORY_CARE_PROVIDER_SITE_OTHER): Payer: BC Managed Care – PPO | Admitting: Family Medicine

## 2023-02-22 ENCOUNTER — Encounter: Payer: Self-pay | Admitting: Family Medicine

## 2023-02-22 ENCOUNTER — Other Ambulatory Visit (HOSPITAL_COMMUNITY)
Admission: RE | Admit: 2023-02-22 | Discharge: 2023-02-22 | Disposition: A | Discharge status: Home / Self Care | Payer: BC Managed Care – PPO | Source: Ambulatory Visit | Attending: Family Medicine | Admitting: Family Medicine

## 2023-02-22 VITALS — BP 118/70 | HR 94 | Resp 16 | Ht 67.0 in | Wt 290.0 lb

## 2023-02-22 DIAGNOSIS — Z0000 Encounter for general adult medical examination without abnormal findings: Secondary | ICD-10-CM

## 2023-02-22 DIAGNOSIS — Z113 Encounter for screening for infections with a predominantly sexual mode of transmission: Secondary | ICD-10-CM | POA: Diagnosis not present

## 2023-02-22 DIAGNOSIS — Z79899 Other long term (current) drug therapy: Secondary | ICD-10-CM | POA: Diagnosis not present

## 2023-02-22 DIAGNOSIS — Z131 Encounter for screening for diabetes mellitus: Secondary | ICD-10-CM

## 2023-02-22 DIAGNOSIS — E785 Hyperlipidemia, unspecified: Secondary | ICD-10-CM | POA: Diagnosis not present

## 2023-02-23 LAB — COMPLETE METABOLIC PANEL WITH GFR
AG Ratio: 1.4 (calc) (ref 1.0–2.5)
ALT: 129 U/L — ABNORMAL HIGH (ref 9–46)
AST: 46 U/L — ABNORMAL HIGH (ref 10–40)
Albumin: 4.4 g/dL (ref 3.6–5.1)
Alkaline phosphatase (APISO): 141 U/L — ABNORMAL HIGH (ref 36–130)
BUN: 11 mg/dL (ref 7–25)
CO2: 28 mmol/L (ref 20–32)
Calcium: 9.4 mg/dL (ref 8.6–10.3)
Chloride: 104 mmol/L (ref 98–110)
Creat: 0.81 mg/dL (ref 0.60–1.26)
Globulin: 3.1 g/dL (calc) (ref 1.9–3.7)
Glucose, Bld: 81 mg/dL (ref 65–99)
Potassium: 5 mmol/L (ref 3.5–5.3)
Sodium: 141 mmol/L (ref 135–146)
Total Bilirubin: 0.6 mg/dL (ref 0.2–1.2)
Total Protein: 7.5 g/dL (ref 6.1–8.1)
eGFR: 122 mL/min/{1.73_m2} (ref >=60)

## 2023-02-23 LAB — CBC WITH DIFFERENTIAL/PLATELET
Absolute Monocytes: 883 cells/uL (ref 200–950)
Basophils Absolute: 70 cells/uL (ref 0–200)
Basophils Relative: 1.1 %
Eosinophils Absolute: 90 cells/uL (ref 15–500)
Eosinophils Relative: 1.4 %
HCT: 44.2 % (ref 38.5–50.0)
Hemoglobin: 14.5 g/dL (ref 13.2–17.1)
Lymphs Abs: 3424 cells/uL (ref 850–3900)
MCH: 25.5 pg — ABNORMAL LOW (ref 27.0–33.0)
MCHC: 32.8 g/dL (ref 32.0–36.0)
MCV: 77.7 fL — ABNORMAL LOW (ref 80.0–100.0)
MPV: 12.5 fL (ref 7.5–12.5)
Monocytes Relative: 13.8 %
Neutro Abs: 1933 cells/uL (ref 1500–7800)
Neutrophils Relative %: 30.2 %
Platelets: 230 10*3/uL (ref 140–400)
RBC: 5.69 10*6/uL (ref 4.20–5.80)
RDW: 13.9 % (ref 11.0–15.0)
Total Lymphocyte: 53.5 %
WBC: 6.4 10*3/uL (ref 3.8–10.8)

## 2023-02-23 LAB — LIPID PANEL
Cholesterol: 204 mg/dL — ABNORMAL HIGH (ref <200)
HDL: 34 mg/dL — ABNORMAL LOW (ref >=40)
LDL Cholesterol (Calc): 147 mg/dL (calc) — ABNORMAL HIGH
Non-HDL Cholesterol (Calc): 170 mg/dL (calc) — ABNORMAL HIGH (ref <130)
Total CHOL/HDL Ratio: 6 (calc) — ABNORMAL HIGH (ref <5.0)
Triglycerides: 116 mg/dL (ref <150)

## 2023-02-23 LAB — URINE CYTOLOGY ANCILLARY ONLY
Chlamydia: NEGATIVE
Comment: NEGATIVE
Comment: NORMAL
Neisseria Gonorrhea: NEGATIVE

## 2023-02-23 LAB — HEMOGLOBIN A1C
Hgb A1c MFr Bld: 6 % of total Hgb — ABNORMAL HIGH (ref <5.7)
Mean Plasma Glucose: 126 mg/dL
eAG (mmol/L): 7 mmol/L

## 2023-02-23 LAB — RPR: RPR Ser Ql: NONREACTIVE

## 2023-02-23 LAB — HIV ANTIBODY (ROUTINE TESTING W REFLEX): HIV 1&2 Ab, 4th Generation: NONREACTIVE

## 2023-02-24 ENCOUNTER — Other Ambulatory Visit: Payer: Self-pay | Admitting: Family Medicine

## 2023-02-24 DIAGNOSIS — R748 Abnormal levels of other serum enzymes: Secondary | ICD-10-CM

## 2023-05-09 ENCOUNTER — Ambulatory Visit: Payer: BC Managed Care – PPO | Admitting: Family Medicine

## 2023-10-03 ENCOUNTER — Other Ambulatory Visit: Payer: Self-pay | Admitting: Family Medicine

## 2023-10-03 DIAGNOSIS — B001 Herpesviral vesicular dermatitis: Secondary | ICD-10-CM

## 2023-10-03 MED ORDER — VALACYCLOVIR HCL 1 G PO TABS: 1000.0000 mg | ORAL_TABLET | Freq: Two times a day (BID) | ORAL | Status: DC | PRN

## 2023-10-05 ENCOUNTER — Encounter: Payer: Self-pay | Admitting: Family Medicine

## 2023-10-05 ENCOUNTER — Other Ambulatory Visit: Payer: Self-pay | Admitting: Family Medicine

## 2023-10-05 ENCOUNTER — Other Ambulatory Visit: Payer: Self-pay

## 2023-10-05 DIAGNOSIS — B001 Herpesviral vesicular dermatitis: Secondary | ICD-10-CM

## 2023-10-05 MED ORDER — VALACYCLOVIR HCL 1 G PO TABS: 1000.0000 mg | ORAL_TABLET | Freq: Two times a day (BID) | ORAL | 0 refills | Status: DC | PRN

## 2024-02-24 ENCOUNTER — Encounter: Payer: Self-pay | Admitting: Family Medicine

## 2024-02-24 ENCOUNTER — Ambulatory Visit (INDEPENDENT_AMBULATORY_CARE_PROVIDER_SITE_OTHER): Payer: Self-pay | Admitting: Family Medicine

## 2024-02-24 VITALS — BP 124/80 | HR 81 | Temp 98.0°F | Resp 16 | Ht 67.0 in | Wt 321.7 lb

## 2024-02-24 DIAGNOSIS — B001 Herpesviral vesicular dermatitis: Secondary | ICD-10-CM

## 2024-02-24 DIAGNOSIS — Z0000 Encounter for general adult medical examination without abnormal findings: Secondary | ICD-10-CM

## 2024-02-24 DIAGNOSIS — E8881 Metabolic syndrome: Secondary | ICD-10-CM

## 2024-02-24 DIAGNOSIS — E785 Hyperlipidemia, unspecified: Secondary | ICD-10-CM | POA: Diagnosis not present

## 2024-02-24 DIAGNOSIS — Z79899 Other long term (current) drug therapy: Secondary | ICD-10-CM | POA: Diagnosis not present

## 2024-02-24 MED ORDER — VALACYCLOVIR HCL 1 G PO TABS: 1000.0000 mg | ORAL_TABLET | Freq: Two times a day (BID) | ORAL | 0 refills | Status: DC | PRN

## 2024-02-24 NOTE — Progress Notes (Signed)
 Name: Derek Khan   MRN: [id number]    DOB: Jul 04, 1991   Date:02/24/2024       Progress Note  Subjective  Chief Complaint  Chief Complaint  Patient presents with   Annual Exam    HPI  Patient presents for annual CPE .    Diet: he is drinking more water, but not mindful about his diet  Exercise: discussed importance of regular physical activity beside work Last Dental Exam: up to date  Last Eye Exam: he will schedule it   Depression: phq 9 is negative    02/24/2024    8:09 AM 02/22/2023    7:58 AM 01/07/2023    8:15 AM 10/05/2022    2:30 PM 07/20/2022    1:53 PM  Depression screen PHQ 2/9  Decreased Interest 0 0 1 0 0  Down, Depressed, Hopeless 0 0 1 0 0  PHQ - 2 Score 0 0 2 0 0  Altered sleeping 0 0 0 0 0  Tired, decreased energy 0 0 0 0 0  Change in appetite 0 0 0 0 0  Feeling bad or failure about yourself  0 0 0 0 0  Trouble concentrating 0 0 0 0 0  Moving slowly or fidgety/restless 0 0 0 0 0  Suicidal thoughts 0 0 0 0 0  PHQ-9 Score 0 0 2 0 0  Difficult doing work/chores Not difficult at all    Not difficult at all    Hypertension:  BP Readings from Last 3 Encounters:  02/24/24 124/80  02/22/23 118/70  01/07/23 118/72    Obesity: Wt Readings from Last 3 Encounters:  02/24/24 (!) 321 lb 11.2 oz (145.9 kg)  02/22/23 290 lb (131.5 kg)  01/07/23 290 lb (131.5 kg)   BMI Readings from Last 3 Encounters:  02/24/24 50.39 kg/m  02/22/23 45.42 kg/m  01/07/23 45.42 kg/m     [employer] Visit from 02/24/2024 in [facility]  AUDIT-C Score 0       Divorced STD testing and prevention (HIV/chl/gon/syphilis):  not applicable Sexual history: one partner, she is pregnant and due in October Hep C Screening: completed Skin cancer: Discussed monitoring for atypical lesions Colorectal cancer: N/A Prostate cancer:  not applicable   Vaccines: reviewed with the patient.   Advanced Care Planning: A voluntary discussion  about advance care planning including the explanation and discussion of advance directives.  Discussed health care proxy and Living will, and the patient was able to identify a health care proxy as father .  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 No pertinent surgical history.  Family History  Problem Relation Age of Onset   Depression Mother    Obesity Mother    Obesity Father    Obesity Sister    ADD / ADHD Brother    Obesity Brother     Social History   Socioeconomic History   Marital status: Divorced    Spouse name: Lesly Rubenstein   Number of children: 0   Years of education: Not on file   Highest education level:  Some college, no degree  Occupational History   Occupation: Mining engineer: [id number]    Comment: physical job  Tobacco Use   Smoking status: Never   Smokeless tobacco: Never  Vaping Use   Vaping status: Never Used  Substance and Sexual Activity   Alcohol use: Not Currently    Alcohol/week: 1.0 standard drink of alcohol    Types: 1 Standard drinks or equivalent per week    Comment: social drinker   Drug use: No   Sexual activity: Yes    Partners: Female    Comment: wife takes opc  Other Topics Concern   Not on file  Social History Narrative   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   Married since 07/2017   Social Drivers of Health   Financial Resource Strain: Low Risk  (02/24/2024)   Overall Financial Resource Strain (CARDIA)    Difficulty of Paying Living Expenses: Not hard at all  Food Insecurity: No Food  Insecurity (02/24/2024)   Hunger Vital Sign    Worried About Running Out of Food in the Last Year: Never true    Ran Out of Food in the Last Year: Never true  Transportation Needs: No Transportation Needs (02/24/2024)   PRAPARE - Administrator, Civil Service (Medical): No    Lack of Transportation (Non-Medical): No  Physical Activity: Inactive (02/24/2024)   Exercise Vital Sign    Days of Exercise per Week: 0 days    Minutes of Exercise per Session: 0 min  Stress: No Stress Concern Present (02/24/2024)   [employer] of Occupational Health - Occupational Stress Questionnaire    Feeling of Stress : Only a little  Social Connections: Moderately Isolated (02/24/2024)   Social Connection and Isolation Panel [NHANES]    Frequency of Communication with Friends and Family: More than three times a week    Frequency of Social Gatherings with Friends and Family: More than three times a week    Attends Religious Services: 1 to 4 times per year    Active Member of [employer] or Organizations: No    Attends Banker Meetings: Never    Marital Status: Divorced  Catering manager Violence: Not At Risk (02/24/2024)   Humiliation, Afraid, Rape, and Kick questionnaire    Fear of Current or Ex-Partner: No    Emotionally Abused: No    Physically Abused: No    Sexually Abused: No     Current Outpatient Medications:    valACYclovir (VALTREX) 1000 MG tablet, Take 1 tablet (1,000 mg total) by mouth 2 (two) times daily as needed. Prn outbreak, Disp: 12 tablet, Rfl: 0   lisdexamfetamine (VYVANSE) 40 MG capsule, Take 1 capsule (40 mg total) by mouth every morning. (Patient not taking: Reported on 02/24/2024), Disp: 90 capsule, Rfl: 0   metFORMIN (GLUCOPHAGE-XR) 500 MG 24 hr tablet, Take 1 tablet (500 mg total) by mouth daily with breakfast. (Patient not taking: Reported on 02/24/2024), Disp: 90 tablet, Rfl: 0  No Known Allergies   ROS  Constitutional: Negative for fever , positive for weight  change.  Respiratory: Negative for cough and shortness of breath.   Cardiovascular: Negative for chest pain or palpitations.  Gastrointestinal: Negative for abdominal pain, no bowel changes.  Musculoskeletal: Negative for gait problem or joint swelling.  Skin: Negative for rash.  Neurological: Negative for dizziness or headache.  No other specific complaints in a complete review of systems (except as listed in  HPI above).   Objective  Vitals:   02/24/24 0811  BP: 124/80  Pulse: 81  Resp: 16  Temp: 98 F (36.7 C)  TempSrc: Oral  SpO2: 99%  Weight: (!) 321 lb 11.2 oz (145.9 kg)  Height: 5\' 7"  (1.702 m)    Body mass index is 50.39 kg/m.  Physical Exam  Constitutional: Patient appears well-developed and well-nourished. No distress.  HENT: Head: Normocephalic and atraumatic. Ears: B TMs ok, no erythema or effusion; Nose: Nose normal. Mouth/Throat: Oropharynx is clear and moist. No oropharyngeal exudate.  Eyes: Conjunctivae and EOM are normal. Pupils are equal, round, and reactive to light. No scleral icterus.  Neck: Normal range of motion. Neck supple. No JVD present. No thyromegaly present.  Cardiovascular: Normal rate, regular rhythm and normal heart sounds.  No murmur heard. No BLE edema. Pulmonary/Chest: Effort normal and breath sounds normal. No respiratory distress. Abdominal: Soft. Bowel sounds are normal, no distension. There is no tenderness. no masses MALE GENITALIA: Normal descended testes bilaterally, no masses palpated, no hernias, no lesions, no discharge RECTAL: not done  Musculoskeletal: Normal range of motion, no joint effusions. No gross deformities Neurological: he is alert and oriented to person, place, and time. No cranial nerve deficit. Coordination, balance, strength, speech and gait are normal.  Skin: Skin is warm and dry. No rash noted. No erythema.  Psychiatric: Patient has a normal mood and affect. behavior is normal. Judgment and thought content normal.      Assessment & Plan  1. Well adult exam (Primary)  - Lipid panel - CBC with Differential/Platelet - Comprehensive metabolic panel with GFR - Hemoglobin A1c  2. Long-term use of high-risk medication  - CBC with Differential/Platelet - Comprehensive metabolic panel with GFR  3. Dyslipidemia  - Lipid panel  4. Metabolic syndrome  - Hemoglobin A1c   5. Fever blister  - valACYclovir (VALTREX) 1000 MG tablet; Take 1 tablet (1,000 mg total) by mouth 2 (two) times daily as needed. Prn outbreak  Dispense: 12 tablet; Refill: 0   -Prostate cancer screening and PSA options (with potential risks and benefits of testing vs not testing) were discussed along with recent recs/guidelines. -USPSTF grade A and B recommendations reviewed with patient; age-appropriate recommendations, preventive care, screening tests, etc discussed and encouraged; healthy living encouraged; see AVS for patient education given to patient -Discussed importance of 150 minutes of physical activity weekly, eat two servings of fish weekly, eat one serving of tree nuts ( cashews, pistachios, pecans, almonds.Marland Kitchen) every other day, eat 6 servings of fruit/vegetables daily and drink plenty of water and avoid sweet beverages.  -Reviewed Health Maintenance: not applicable

## 2024-02-25 LAB — COMPREHENSIVE METABOLIC PANEL WITH GFR
AG Ratio: 2 (calc) (ref 1.0–2.5)
ALT: 23 U/L (ref 9–46)
AST: 19 U/L (ref 10–40)
Albumin: 4.7 g/dL (ref 3.6–5.1)
Alkaline phosphatase (APISO): 61 U/L (ref 36–130)
BUN: 12 mg/dL (ref 7–25)
CO2: 29 mmol/L (ref 20–32)
Calcium: 9.4 mg/dL (ref 8.6–10.3)
Chloride: 105 mmol/L (ref 98–110)
Creat: 0.69 mg/dL (ref 0.60–1.26)
Globulin: 2.3 g/dL (ref 1.9–3.7)
Glucose, Bld: 81 mg/dL (ref 65–99)
Potassium: 4.6 mmol/L (ref 3.5–5.3)
Sodium: 140 mmol/L (ref 135–146)
Total Bilirubin: 0.6 mg/dL (ref 0.2–1.2)
Total Protein: 7 g/dL (ref 6.1–8.1)
eGFR: 127 mL/min/{1.73_m2} (ref >=60)

## 2024-02-25 LAB — CBC WITH DIFFERENTIAL/PLATELET
Absolute Lymphocytes: 2067 {cells}/uL (ref 850–3900)
Absolute Monocytes: 600 {cells}/uL (ref 200–950)
Basophils Absolute: 53 {cells}/uL (ref 0–200)
Basophils Relative: 0.7 %
Eosinophils Absolute: 243 {cells}/uL (ref 15–500)
Eosinophils Relative: 3.2 %
HCT: 42.5 % (ref 38.5–50.0)
Hemoglobin: 13.9 g/dL (ref 13.2–17.1)
MCH: 26.6 pg — ABNORMAL LOW (ref 27.0–33.0)
MCHC: 32.7 g/dL (ref 32.0–36.0)
MCV: 81.3 fL (ref 80.0–100.0)
MPV: 12.2 fL (ref 7.5–12.5)
Monocytes Relative: 7.9 %
Neutro Abs: 4636 {cells}/uL (ref 1500–7800)
Neutrophils Relative %: 61 %
Platelets: 202 10*3/uL (ref 140–400)
RBC: 5.23 10*6/uL (ref 4.20–5.80)
RDW: 13.7 % (ref 11.0–15.0)
Total Lymphocyte: 27.2 %
WBC: 7.6 10*3/uL (ref 3.8–10.8)

## 2024-02-25 LAB — LIPID PANEL
Cholesterol: 185 mg/dL (ref <200)
HDL: 46 mg/dL (ref >=40)
LDL Cholesterol (Calc): 122 mg/dL — ABNORMAL HIGH
Non-HDL Cholesterol (Calc): 139 mg/dL — ABNORMAL HIGH (ref <130)
Total CHOL/HDL Ratio: 4 (calc) (ref <5.0)
Triglycerides: 71 mg/dL (ref <150)

## 2024-02-25 LAB — HEMOGLOBIN A1C
Hgb A1c MFr Bld: 5.7 %{Hb} — ABNORMAL HIGH (ref <5.7)
Mean Plasma Glucose: 117 mg/dL
eAG (mmol/L): 6.5 mmol/L

## 2024-02-27 ENCOUNTER — Encounter: Payer: Self-pay | Admitting: Family Medicine

## 2024-06-20 ENCOUNTER — Ambulatory Visit: Admitting: Family Medicine

## 2024-06-20 DIAGNOSIS — F988 Other specified behavioral and emotional disorders with onset usually occurring in childhood and adolescence: Secondary | ICD-10-CM

## 2024-06-20 DIAGNOSIS — E8881 Metabolic syndrome: Secondary | ICD-10-CM | POA: Diagnosis not present

## 2024-06-20 DIAGNOSIS — E785 Hyperlipidemia, unspecified: Secondary | ICD-10-CM

## 2024-06-20 DIAGNOSIS — R0683 Snoring: Secondary | ICD-10-CM | POA: Diagnosis not present

## 2024-06-20 NOTE — Progress Notes (Signed)
 Name: Derek Bellaire Samet   MRN: [id number]    DOB: November 25, 1990   Date:06/20/2024       Progress Note  Subjective  Chief Complaint  Chief Complaint  Patient presents with   Referral    Sleep study, snores   Discussed the use of AI scribe software for clinical note transcription with the patient, who gave verbal consent to proceed.  History of Present Illness Derek Khan is a 33 year old male with metabolic syndrome who presents for a follow-up regarding snoring and potential sleep apnea.  He experiences snoring and episodes of waking up gasping for air, as noted by his past and current partners. He has not yet undergone a sleep study, despite a previous referral several years ago, and now wants to follow through with this evaluation. He feels tired during the day, even after a full night's sleep, and sometimes falls asleep while sitting. During the review of symptoms, he confirms experiencing daytime sleepiness and fatigue. No other symptoms are reported.  He has a history of metabolic syndrome, dyslipidemia, and morbid obesity, with a BMI of 52.37. He acknowledges weight gain since his last physical, increasing from 321 to 334 pounds, attributing this to decreased physical activity and increased portion sizes. He and his pregnant girlfriend have been cooking more at home to eat healthier and he wants to model healthy eating and physical activity for their upcoming child.  His past medical history includes elevated liver enzymes, which have since normalized, and a previous A1c of 6, now reduced to 5.7. He has a history of taking medication for ADD, which he notes helped with appetite control and focus. He is currently not on any weight loss medication and prefers to attempt lifestyle modifications first.  He lives with his pregnant girlfriend and he is expecting their first child. His brother has a son and another child on the way.    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 No pertinent surgical history.  Family History  Problem Relation Age of Onset   Depression Mother    Obesity Mother    Obesity Father    Obesity Sister    ADD / ADHD Brother    Obesity Brother    Anxiety disorder Brother     Social History   Tobacco Use   Smoking status: Never   Smokeless tobacco: Never  Substance Use Topics   Alcohol use: Not Currently    Alcohol/week: 1.0 standard drink of alcohol    Types: 1 Standard drinks or equivalent per week    Comment: social drinker     Current Outpatient Medications:    valACYclovir  (VALTREX ) 1000 MG tablet, Take 1 tablet (1,000 mg total) by mouth 2 (two) times daily as needed. Prn outbreak, Disp: 12 tablet, Rfl: 0  No Known Allergies  I personally reviewed active problem list, medication list, allergies with the patient/caregiver today.   ROS  Ten systems reviewed and is negative except as mentioned in HPI    Objective Physical Exam VITALS: BP- 122/84 MEASUREMENTS: Weight- 334, BMI- 52.37. CONSTITUTIONAL: Patient appears well-developed and well-nourished.  No distress. HEENT: Head atraumatic, normocephalic, neck supple. CARDIOVASCULAR: Normal rate, regular  rhythm and normal heart sounds.  No murmur heard. No BLE edema. PULMONARY: Effort normal and breath sounds normal. No respiratory distress. ABDOMINAL: There is no tenderness or distention. MUSCULOSKELETAL: Normal gait. Without gross motor or sensory deficit. PSYCHIATRIC: Patient has a normal mood and affect. behavior is normal. Judgment and thought content normal.  Vitals:   06/20/24 0931  BP: 122/84  Pulse: 91  Resp: 16  SpO2: 97%   Weight: (!) 334 lb 6.4 oz (151.7 kg)  Height: 5' 7 (1.702 m)    Body mass index is 52.37 kg/m.    PHQ2/9:    06/20/2024    9:31 AM 02/24/2024    8:09 AM 02/22/2023    7:58 AM 01/07/2023    8:15 AM 10/05/2022    2:30 PM  Depression screen PHQ 2/9  Decreased Interest 0 0 0 1 0  Down, Depressed, Hopeless 0 0 0 1 0  PHQ - 2 Score 0 0 0 2 0  Altered sleeping  0 0 0 0  Tired, decreased energy  0 0 0 0  Change in appetite  0 0 0 0  Feeling bad or failure about yourself   0 0 0 0  Trouble concentrating  0 0 0 0  Moving slowly or fidgety/restless  0 0 0 0  Suicidal thoughts  0 0 0 0  PHQ-9 Score  0 0 2 0  Difficult doing work/chores  Not difficult at all       phq 9 is negative  Fall Risk:    06/20/2024    9:31 AM 02/22/2023    7:57 AM 01/07/2023    8:15 AM 10/05/2022    2:29 PM 07/20/2022    1:53 PM  Fall Risk   Falls in the past year? 0 0 0 0 0  Number falls in past yr: 0 0 0  0  Injury with Fall? 0 0 0  0  Risk for fall due to : No Fall Risks No Fall Risks No Fall Risks No Fall Risks No Fall Risks  Follow up Falls evaluation completed Falls prevention discussed Falls prevention discussed Falls prevention discussed  Falls prevention discussed;Education provided      Data saved with a previous flowsheet row definition      Assessment & Plan Suspected obstructive sleep apnea Symptoms and elevated Epworth Sleepiness Scale suggest high likelihood of sleep apnea. Home study preferred for comfort and insurance reasons. - Order home sleep study to confirm diagnosis. - Discuss potential CPAP use if confirmed. - Consider modafinil for persistent daytime sleepiness post-confirmation.  Morbid obesity, BMI 52.37 BMI 52.37 with weight increase from 321 lbs to 334 lbs. Discussed importance of healthy lifestyle for upcoming child. - Encourage weight loss through increased physical activity and dietary modifications. - Advise eliminating sugary beverages from diet. - Set goal to  return to 321 lbs with gradual weight loss of 1-2 lbs per week.  Metabolic syndrome Improved A1c from 6.0 to 5.7 and normalized liver enzymes. Emphasized sustaining lifestyle modifications. - Encourage lifestyle modifications to maintain improved metabolic parameters.  Dyslipidemia Part of metabolic syndrome. - Continue lifestyle modifications to improve lipid profile.  Prediabetes Improved A1c from 6.0 to 5.7, associated with metabolic syndrome and obesity. - Continue lifestyle modifications to prevent progression to diabetes.

## 2024-06-20 NOTE — Addendum Note (Signed)
 Addended by: RENTERIA-GARCIA, Jozlin Bently on: 06/20/2024 10:37 AM   Modules accepted: Orders

## 2024-07-11 ENCOUNTER — Encounter: Payer: Self-pay | Admitting: Family Medicine

## 2024-07-27 ENCOUNTER — Encounter: Payer: Self-pay | Admitting: Family Medicine

## 2024-07-27 DIAGNOSIS — G4733 Obstructive sleep apnea (adult) (pediatric): Secondary | ICD-10-CM | POA: Insufficient documentation

## 2024-09-21 ENCOUNTER — Ambulatory Visit: Admitting: Family Medicine

## 2024-11-05 ENCOUNTER — Ambulatory Visit: Admitting: Family Medicine

## 2024-11-05 VITALS — BP 128/78 | HR 79 | Resp 16 | Ht 67.0 in | Wt 345.6 lb

## 2024-11-05 DIAGNOSIS — G4733 Obstructive sleep apnea (adult) (pediatric): Secondary | ICD-10-CM

## 2024-11-05 DIAGNOSIS — R21 Rash and other nonspecific skin eruption: Secondary | ICD-10-CM | POA: Diagnosis not present

## 2024-11-05 DIAGNOSIS — B001 Herpesviral vesicular dermatitis: Secondary | ICD-10-CM

## 2024-11-05 MED ORDER — VALACYCLOVIR HCL 1 G PO TABS: 1000.0000 mg | ORAL_TABLET | Freq: Two times a day (BID) | ORAL | 2 refills | Status: AC | PRN

## 2024-11-05 NOTE — Progress Notes (Signed)
 Name: Derek Khan   MRN: [id number]    DOB: 1990-12-24   Date:11/05/2024       Progress Note  Subjective  Chief Complaint  Chief Complaint  Patient presents with   Rash    Bumps on penis x2 days ago, irritates, started like blisters and then pimples with white heads.   Discussed the use of AI scribe software for clinical note transcription with the patient, who gave verbal consent to proceed.  History of Present Illness Derek Khan is a 33 year old male who presents with bumps on his penis.  He noticed bumps on his penis approximately two days ago, localized to one area, initially causing a burning sensation. There have been no significant changes since then. He has a history of fever blisters and has never experienced genital herpes before. He has not engaged in intercourse since noticing the bumps. No recent fever or new sexual partners. He experiences discomfort but not constant pain from the penile bumps.  He is currently not taking any medications regularly except for Valtrex , which he uses for fever blisters. He did not take Valtrex  for the current outbreak as he wanted to confirm the condition first.  Regarding his sleep apnea, he completed a sleep study. However, he has not obtained a CPAP machine due to insurance issues and cost concerns. He experiences episodes of waking up feeling breathless.  He acknowledges a recent weight gain, attributing it to inactivity during a leave of absence. He weighs 345 pounds, up from 334 pounds, and has a BMI over 54. He and his girlfriend have discussed weight loss strategies, including portion control and increased physical activity, but have not yet implemented these changes.    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 No pertinent surgical history.  Family History  Problem Relation Age of Onset   Depression Mother    Obesity Mother    Obesity Father    Obesity Sister    ADD / ADHD Brother    Obesity Brother    Anxiety disorder Brother     Social History   Tobacco Use   Smoking status: Never   Smokeless tobacco: Never  Substance Use Topics   Alcohol use: Not Currently    Alcohol/week: 1.0 standard drink of alcohol    Types: 1 Standard drinks or equivalent per week    Comment: social drinker    Current Medications[1]  Allergies[2]  I personally reviewed active problem list, medication list, allergies with the patient/caregiver today.   ROS  Ten systems reviewed and is negative except as mentioned in HPI    Objective Physical Exam MEASUREMENTS: Weight- 345, BMI- 54.0. CONSTITUTIONAL: Patient appears well-developed and well-nourished. No distress. HEENT: Head atraumatic, normocephalic, neck supple. CARDIOVASCULAR: Normal rate, regular rhythm and normal heart sounds. No murmur heard. No BLE edema. PULMONARY: Effort normal and breath sounds normal. No respiratory distress. ABDOMINAL: There is no tenderness or distention. MUSCULOSKELETAL: Normal gait. Without gross motor or sensory deficit. PSYCHIATRIC: Patient has a normal mood and affect. Behavior is normal. Judgment and thought content normal. GENITOURINARY: Ulceration with moisture on the left side below the glans  of the penis.  Vitals:   11/05/24 1004  BP: 128/78  Pulse: 79  Resp: 16  SpO2: 98%  Weight: (!) 345 lb 9.6 oz (156.8 kg)  Height: 5' 7 (1.702 m)    Body mass index is 54.13 kg/m.    PHQ2/9:    11/05/2024    9:55 AM 06/20/2024    9:31 AM 02/24/2024    8:09 AM 02/22/2023    7:58 AM 01/07/2023    8:15 AM  Depression screen PHQ 2/9  Decreased Interest 0 0 0  0 1  Down, Depressed, Hopeless 0 0 0 0 1  PHQ - 2 Score 0 0 0 0 2  Altered sleeping   0 0 0  Tired, decreased energy   0 0 0  Change in appetite   0 0 0  Feeling bad or failure about yourself    0 0 0  Trouble concentrating   0 0 0  Moving slowly or fidgety/restless   0 0 0  Suicidal thoughts   0 0 0  PHQ-9 Score   0  0  2   Difficult doing work/chores   Not difficult at all       Data saved with a previous flowsheet row definition    phq 9 is negative  Fall Risk:    11/05/2024    9:55 AM 06/20/2024    9:31 AM 02/22/2023    7:57 AM 01/07/2023    8:15 AM 10/05/2022    2:29 PM  Fall Risk   Falls in the past year? 0 0 0 0 0  Number falls in past yr: 0 0 0 0   Injury with Fall? 0 0  0  0    Risk for fall due to : No Fall Risks No Fall Risks No Fall Risks No Fall Risks No Fall Risks  Follow up Falls evaluation completed Falls evaluation completed Falls prevention discussed Falls prevention discussed Falls prevention discussed      Data saved with a previous flowsheet row definition      Assessment & Plan Rash penis, likely genital herpes.  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  Discussed asymptomatic shedding, transmission risks, and importance of informing partners. - Ordered herpes simplex virus culture for type 1 and 2. - Prescribed Valtrex  1000 mg every 12 hours for 7 days. - Advised no sexual activity until culture results are available. - Discussed potential for daily suppressive therapy with Valtrex  post-initial treatment.  Morbid obesity BMI 345, increased from 334. Discussed health impact and weight loss benefits. Emphasized portion control and physical activity. - Encouraged portion control and use of smaller plates. - Recommended meal prepping and increased physical activity. - Suggested eating low-calorie foods like spinach before meals to reduce overall intake.  Obstructive sleep apnea Diagnosed with  obstructive sleep apnea. Insurance does not cover CPAP. Discussed positional therapy and weight loss importance. - Advised sleeping on side to reduce apnea episodes. - Recommended use of body pillow to maintain side sleeping position. - Encouraged weight loss as a long-term management strategy.  Refused flu shot, counseled him about having an infant at home        [1]  Current Outpatient Medications:    valACYclovir  (VALTREX ) 1000 MG tablet, Take 1 tablet (1,000 mg total) by mouth 2 (two) times daily as needed. Prn outbreak, Disp: 12 tablet, Rfl: 0 [2] No Known Allergies

## 2024-11-06 NOTE — Addendum Note (Signed)
 Addended by: RENTERIA-GARCIA, Tarin Navarez on: 11/06/2024 10:54 AM   Modules accepted: Orders

## 2024-11-09 ENCOUNTER — Ambulatory Visit: Payer: Self-pay | Admitting: Family Medicine

## 2024-11-09 ENCOUNTER — Other Ambulatory Visit: Payer: Self-pay | Admitting: Family Medicine

## 2024-11-09 DIAGNOSIS — B001 Herpesviral vesicular dermatitis: Secondary | ICD-10-CM

## 2024-11-13 LAB — HERPES SIMPLEX VIRUS CULTURE
MICRO NUMBER:: 17356322
SPECIMEN QUALITY:: ADEQUATE

## 2024-11-13 LAB — TEST AUTHORIZATION

## 2024-11-13 LAB — HERPES SIMPLEX TYPING
MICRO NUMBER:: 17391629
SPECIMEN QUALITY:: ADEQUATE

## 2025-03-01 ENCOUNTER — Encounter: Admitting: Family Medicine
